# Patient Record
Sex: Male | Born: 1955 | Race: White | Hispanic: No | Marital: Married | Smoking: Never smoker
Health system: Southern US, Community
[De-identification: ages and names within clinical notes are randomized; demographics above are authoritative.]

## PROBLEM LIST (undated history)

## (undated) DIAGNOSIS — M25519 Pain in unspecified shoulder: Secondary | ICD-10-CM

## (undated) DIAGNOSIS — Z9989 Dependence on other enabling machines and devices: Principal | ICD-10-CM

## (undated) DIAGNOSIS — A6000 Herpesviral infection of urogenital system, unspecified: Secondary | ICD-10-CM

## (undated) DIAGNOSIS — H9190 Unspecified hearing loss, unspecified ear: Secondary | ICD-10-CM

## (undated) DIAGNOSIS — N418 Other inflammatory diseases of prostate: Secondary | ICD-10-CM

## (undated) DIAGNOSIS — F411 Generalized anxiety disorder: Secondary | ICD-10-CM

## (undated) DIAGNOSIS — Z5181 Encounter for therapeutic drug level monitoring: Secondary | ICD-10-CM

## (undated) DIAGNOSIS — R7303 Prediabetes: Secondary | ICD-10-CM

## (undated) DIAGNOSIS — K648 Other hemorrhoids: Secondary | ICD-10-CM

## (undated) DIAGNOSIS — F329 Major depressive disorder, single episode, unspecified: Secondary | ICD-10-CM

## (undated) DIAGNOSIS — M5020 Other cervical disc displacement, unspecified cervical region: Secondary | ICD-10-CM

## (undated) DIAGNOSIS — G4733 Obstructive sleep apnea (adult) (pediatric): Principal | ICD-10-CM

## (undated) DIAGNOSIS — F32A Depression, unspecified: Secondary | ICD-10-CM

## (undated) DIAGNOSIS — E7800 Pure hypercholesterolemia, unspecified: Secondary | ICD-10-CM

## (undated) DIAGNOSIS — G4730 Sleep apnea, unspecified: Secondary | ICD-10-CM

## (undated) DIAGNOSIS — E559 Vitamin D deficiency, unspecified: Secondary | ICD-10-CM

## (undated) DIAGNOSIS — R51 Headache: Secondary | ICD-10-CM

## (undated) DIAGNOSIS — M545 Low back pain, unspecified: Secondary | ICD-10-CM

## (undated) DIAGNOSIS — C4491 Basal cell carcinoma of skin, unspecified: Secondary | ICD-10-CM

## (undated) DIAGNOSIS — M1990 Unspecified osteoarthritis, unspecified site: Secondary | ICD-10-CM

## (undated) DIAGNOSIS — B078 Other viral warts: Secondary | ICD-10-CM

## (undated) DIAGNOSIS — G4700 Insomnia, unspecified: Secondary | ICD-10-CM

## (undated) DIAGNOSIS — G478 Other sleep disorders: Principal | ICD-10-CM

## (undated) DIAGNOSIS — M4802 Spinal stenosis, cervical region: Secondary | ICD-10-CM

## (undated) HISTORY — DX: Low back pain, unspecified: M54.50

## (undated) HISTORY — DX: Low back pain: M54.5

## (undated) HISTORY — DX: Other viral warts: B07.8

## (undated) HISTORY — DX: Basal cell carcinoma of skin, unspecified: C44.91

## (undated) HISTORY — DX: Pure hypercholesterolemia, unspecified: E78.00

## (undated) HISTORY — DX: Unspecified hearing loss, unspecified ear: H91.90

## (undated) HISTORY — DX: Generalized anxiety disorder: F41.1

## (undated) HISTORY — DX: Other inflammatory diseases of prostate: N41.8

## (undated) HISTORY — DX: Obstructive sleep apnea (adult) (pediatric): G47.33

## (undated) HISTORY — PX: VASECTOMY: SHX75

## (undated) HISTORY — DX: Herpesviral infection of urogenital system, unspecified: A60.00

## (undated) HISTORY — DX: Depression, unspecified: F32.A

## (undated) HISTORY — DX: Pain in unspecified shoulder: M25.519

## (undated) HISTORY — DX: Other sleep disorders: G47.8

## (undated) HISTORY — DX: Headache: R51

## (undated) HISTORY — DX: Unspecified osteoarthritis, unspecified site: M19.90

## (undated) HISTORY — DX: Major depressive disorder, single episode, unspecified: F32.9

## (undated) HISTORY — DX: Insomnia, unspecified: G47.00

## (undated) HISTORY — DX: Sleep apnea, unspecified: G47.30

## (undated) HISTORY — DX: Dependence on other enabling machines and devices: Z99.89

## (undated) HISTORY — DX: Encounter for therapeutic drug level monitoring: Z51.81

## (undated) HISTORY — DX: Vitamin D deficiency, unspecified: E55.9

## (undated) HISTORY — DX: Other hemorrhoids: K64.8

---

## 1989-08-04 HISTORY — PX: KNEE SURGERY: SHX244

## 2007-03-12 ENCOUNTER — Ambulatory Visit: Payer: Self-pay | Admitting: Family Medicine

## 2008-08-04 HISTORY — PX: COLONOSCOPY: SHX174

## 2008-08-04 HISTORY — PX: PROSTATE SURGERY: SHX751

## 2008-09-18 ENCOUNTER — Ambulatory Visit: Payer: Self-pay | Admitting: Family Medicine

## 2008-09-27 ENCOUNTER — Ambulatory Visit: Payer: Self-pay | Admitting: Gastroenterology

## 2008-09-27 LAB — HM COLONOSCOPY

## 2010-11-03 HISTORY — PX: OTHER SURGICAL HISTORY: SHX169

## 2010-12-10 ENCOUNTER — Ambulatory Visit: Payer: Self-pay | Admitting: Family Medicine

## 2012-06-17 ENCOUNTER — Encounter: Payer: Self-pay | Admitting: *Deleted

## 2012-06-28 ENCOUNTER — Ambulatory Visit (INDEPENDENT_AMBULATORY_CARE_PROVIDER_SITE_OTHER): Payer: BC Managed Care – PPO | Admitting: Family Medicine

## 2012-06-28 ENCOUNTER — Encounter: Payer: Self-pay | Admitting: Family Medicine

## 2012-06-28 VITALS — BP 130/86 | HR 62 | Temp 98.0°F | Resp 16 | Ht 68.75 in | Wt 196.6 lb

## 2012-06-28 DIAGNOSIS — Z0000 Encounter for general adult medical examination without abnormal findings: Secondary | ICD-10-CM

## 2012-06-28 DIAGNOSIS — Z23 Encounter for immunization: Secondary | ICD-10-CM

## 2012-06-28 LAB — CBC WITH DIFFERENTIAL/PLATELET
Basophils Absolute: 0 10*3/uL (ref 0.0–0.1)
Basophils Relative: 1 % (ref 0–1)
Eosinophils Absolute: 0.2 10*3/uL (ref 0.0–0.7)
Eosinophils Relative: 3 % (ref 0–5)
Lymphs Abs: 1.2 10*3/uL (ref 0.7–4.0)
MCH: 33.7 pg (ref 26.0–34.0)
MCHC: 36.4 g/dL — ABNORMAL HIGH (ref 30.0–36.0)
MCV: 92.4 fL (ref 78.0–100.0)
Neutrophils Relative %: 61 % (ref 43–77)
Platelets: 230 10*3/uL (ref 150–400)
RDW: 12.3 % (ref 11.5–15.5)

## 2012-06-28 LAB — COMPREHENSIVE METABOLIC PANEL
ALT: 43 U/L (ref 0–53)
Alkaline Phosphatase: 42 U/L (ref 39–117)
CO2: 27 mEq/L (ref 19–32)
Creat: 0.85 mg/dL (ref 0.50–1.35)
Glucose, Bld: 117 mg/dL — ABNORMAL HIGH (ref 70–99)
Sodium: 138 mEq/L (ref 135–145)
Total Bilirubin: 0.6 mg/dL (ref 0.3–1.2)
Total Protein: 6.4 g/dL (ref 6.0–8.3)

## 2012-06-28 LAB — LIPID PANEL
Cholesterol: 168 mg/dL (ref 0–200)
LDL Cholesterol: 88 mg/dL (ref 0–99)
Total CHOL/HDL Ratio: 3.5 Ratio
Triglycerides: 158 mg/dL — ABNORMAL HIGH (ref <150)
VLDL: 32 mg/dL (ref 0–40)

## 2012-06-28 LAB — POCT URINALYSIS DIPSTICK
Blood, UA: NEGATIVE
Glucose, UA: NEGATIVE
Nitrite, UA: NEGATIVE
Protein, UA: NEGATIVE
Spec Grav, UA: 1.01
Urobilinogen, UA: 0.2

## 2012-06-28 LAB — CK: Total CK: 104 U/L (ref 7–232)

## 2012-06-28 LAB — PSA: PSA: 0.41 ng/mL (ref <=4.00)

## 2012-06-28 MED ORDER — SIMVASTATIN 80 MG PO TABS: 80.0000 mg | ORAL_TABLET | Freq: Every day | ORAL | Status: DC

## 2012-06-28 MED ORDER — INDOMETHACIN 25 MG PO CAPS: 25.0000 mg | ORAL_CAPSULE | Freq: Two times a day (BID) | ORAL | Status: DC

## 2012-06-28 MED ORDER — ESCITALOPRAM OXALATE 20 MG PO TABS: 20.0000 mg | ORAL_TABLET | Freq: Every day | ORAL | Status: DC

## 2012-06-28 MED ORDER — ACYCLOVIR 800 MG PO TABS: 800.0000 mg | ORAL_TABLET | Freq: Three times a day (TID) | ORAL | Status: DC

## 2012-06-28 MED ORDER — ALPRAZOLAM 1 MG PO TABS: 1.0000 mg | ORAL_TABLET | Freq: Every evening | ORAL | Status: DC | PRN

## 2012-06-28 NOTE — Progress Notes (Signed)
Subjective:    Patient ID: John Preston, male    DOB: October 21, 1955, 56 y.o.   MRN: [id number]  HPIThis 56 y.o. male presents to establish care and for CPE.  Last  CPE 06/16/11.  Colonoscopy 2010.  TDAP 06/16/11.  Influenza vaccine yearly; requesting.  Eye exam 06/2012; +readers. Dental exam  Every six months.     Review of Systems  Constitutional: Negative for fever, chills, diaphoresis, activity change, appetite change, fatigue and unexpected weight change.  HENT: Negative for hearing loss, ear pain, nosebleeds, congestion, sore throat, facial swelling, rhinorrhea, sneezing, drooling, mouth sores, trouble swallowing, neck pain, neck stiffness, dental problem, voice change, postnasal drip, sinus pressure, tinnitus and ear discharge.   Eyes: Positive for visual disturbance. Negative for photophobia, pain, discharge, redness and itching.  Respiratory: Negative for apnea, cough, choking, chest tightness, shortness of breath, wheezing and stridor.   Cardiovascular: Negative for chest pain, palpitations and leg swelling.  Gastrointestinal: Negative for nausea, vomiting, abdominal pain, diarrhea, constipation, blood in stool, abdominal distention, anal bleeding and rectal pain.  Endocrine: Negative for cold intolerance, heat intolerance, polydipsia, polyphagia and polyuria.  Genitourinary: Positive for difficulty urinating. Negative for dysuria, urgency, frequency, hematuria, flank pain, decreased urine volume, discharge, penile swelling, scrotal swelling, enuresis, genital sores, penile pain and testicular pain.  Musculoskeletal: Positive for back pain. Negative for myalgias, joint swelling, arthralgias and gait problem.  Skin: Negative for color change, pallor, rash and wound.  Allergic/Immunologic: Negative for immunocompromised state.  Neurological: Negative for dizziness, tremors, seizures, syncope, facial asymmetry, speech difficulty, weakness, light-headedness, numbness and headaches.    Hematological: Negative for adenopathy. Does not bruise/bleed easily.  Psychiatric/Behavioral: Positive for sleep disturbance. Negative for suicidal ideas, hallucinations, behavioral problems, confusion, self-injury, dysphoric mood, decreased concentration and agitation. The patient is nervous/anxious. The patient is not hyperactive.        Objective:   Physical Exam  Nursing note and vitals reviewed. Constitutional: He is oriented to person, place, and time. He appears well-developed and well-nourished. No distress.  HENT:  Head: Normocephalic and atraumatic.  Right Ear: External ear normal.  Left Ear: External ear normal.  Nose: Nose normal.  Mouth/Throat: Oropharynx is clear and moist.  Eyes: Conjunctivae and EOM are normal. Pupils are equal, round, and reactive to light.  Neck: Normal range of motion. Neck supple. No JVD present. No thyromegaly present.  Cardiovascular: Normal rate, regular rhythm, normal heart sounds and intact distal pulses.  Exam reveals no gallop and no friction rub.   No murmur heard. Pulmonary/Chest: Effort normal and breath sounds normal. He has no wheezes. He has no rales.  Abdominal: Soft. Bowel sounds are normal. He exhibits no distension and no mass. There is no tenderness. There is no rebound and no guarding. Hernia confirmed negative in the right inguinal area and confirmed negative in the left inguinal area.  Genitourinary: Rectum normal, prostate normal, testes normal and penis normal. Right testis shows no mass, no swelling and no tenderness. Left testis shows no mass, no swelling and no tenderness. No penile tenderness.  Musculoskeletal:       Right shoulder: Normal.       Left shoulder: Normal.       Cervical back: Normal.       Thoracic back: Normal.       Lumbar back: Normal.  Lymphadenopathy:    He has no cervical adenopathy.       Right: No inguinal adenopathy present.       Left: No inguinal adenopathy present.  Neurological: He is alert  and oriented to person, place, and time. He has normal reflexes. No cranial nerve deficit. He exhibits normal muscle tone. Coordination normal.  Skin: Skin is warm and dry. No rash noted. He is not diaphoretic. No erythema. No pallor.  +tattoos.  Psychiatric: He has a normal mood and affect. His behavior is normal. Judgment and thought content normal.  +anxious.    EKG: NSR.  INFLUENZA VACCINE ADMINISTERED.      Results for orders placed in visit on 06/28/12  POCT URINALYSIS DIPSTICK      Component Value Range   Color, UA yellow     Clarity, UA clear     Glucose, UA neg     Bilirubin, UA neg     Ketones, UA neg     Spec Grav, UA 1.010     Blood, UA neg     pH, UA 6.0     Protein, UA neg     Urobilinogen, UA 0.2     Nitrite, UA neg     Leukocytes, UA Negative      Assessment & Plan:  Routine general medical examination at a health care facility - Plan: POCT urinalysis dipstick, EKG 12-Lead, CBC with Differential, CK, Comprehensive metabolic panel, Lipid panel, Hemoglobin A1c, TSH, Vitamin B12, Folate, Vitamin D 25 hydroxy, PSA  Need for prophylactic vaccination and inoculation against influenza - Plan: Flu vaccine greater than or equal to 3yo preservative free IM    1. CPE:  Anticipatory guidance provided --- exercise, weight loss.  Immunizations UTD: s/p influenza vaccine in office.  Obtain labs.  Colonoscopy UTD. 2.  S/p influenza vaccine in office. 3. Hyperlipidemia: controlled; obtain labs; continue current medication. 4. Anxiety: persistent; continue current medications.  Meds ordered this encounter  Medications  . acyclovir (ZOVIRAX) 800 MG tablet    Sig: Take 1 tablet (800 mg total) by mouth 3 (three) times daily.    Dispense:  30 tablet    Refill:  4  . ALPRAZolam (XANAX) 1 MG tablet    Sig: Take 1 tablet (1 mg total) by mouth at bedtime as needed.    Dispense:  30 tablet    Refill:  5  . escitalopram (LEXAPRO) 20 MG tablet    Sig: Take 1 tablet (20 mg  total) by mouth daily.    Dispense:  30 tablet    Refill:  5  . indomethacin (INDOCIN) 25 MG capsule    Sig: Take 1 capsule (25 mg total) by mouth 2 (two) times daily with a meal.    Dispense:  60 capsule    Refill:  3  . simvastatin (ZOCOR) 80 MG tablet    Sig: Take 1 tablet (80 mg total) by mouth at bedtime.    Dispense:  30 tablet    Refill:  11

## 2012-06-28 NOTE — Patient Instructions (Addendum)
1. Routine general medical examination at a health care facility  POCT urinalysis dipstick, EKG 12-Lead, CBC with Differential, CK, Comprehensive metabolic panel, Lipid panel, Hemoglobin A1c, TSH, Vitamin B12, Folate, Vitamin D 25 hydroxy, PSA  2. Need for prophylactic vaccination and inoculation against influenza       1.  DECREASE LEXAPRO 20MG  TO 1/2 TABLET DAILY.

## 2012-06-29 LAB — VITAMIN D 25 HYDROXY (VIT D DEFICIENCY, FRACTURES): Vit D, 25-Hydroxy: 47 ng/mL (ref 30–89)

## 2012-09-18 NOTE — Progress Notes (Signed)
Reviewed and agree.

## 2012-10-21 ENCOUNTER — Encounter: Payer: Self-pay | Admitting: Family Medicine

## 2013-01-03 ENCOUNTER — Ambulatory Visit: Payer: BC Managed Care – PPO | Admitting: Family Medicine

## 2013-01-05 ENCOUNTER — Other Ambulatory Visit: Payer: Self-pay | Admitting: Family Medicine

## 2013-01-06 ENCOUNTER — Telehealth: Payer: Self-pay

## 2013-01-06 MED ORDER — ALPRAZOLAM 1 MG PO TABS: 1.0000 mg | ORAL_TABLET | Freq: Every evening | ORAL | Status: DC | PRN

## 2013-01-06 NOTE — Telephone Encounter (Signed)
Pt came to 104 to request refill on xanax (generic). He said that he had already contacted the [facility] in [place]. Has an appt to see dr Katrinka Blazing on 01-19-13.

## 2013-01-06 NOTE — Telephone Encounter (Signed)
Called in called patient.

## 2013-01-06 NOTE — Telephone Encounter (Signed)
Please advise pended 

## 2013-01-06 NOTE — Telephone Encounter (Signed)
Please call in Xanax refill and advise pt that called in.

## 2013-01-19 ENCOUNTER — Ambulatory Visit: Payer: BC Managed Care – PPO | Admitting: Family Medicine

## 2013-01-20 ENCOUNTER — Ambulatory Visit (INDEPENDENT_AMBULATORY_CARE_PROVIDER_SITE_OTHER): Payer: PRIVATE HEALTH INSURANCE | Admitting: Family Medicine

## 2013-01-20 VITALS — BP 122/70 | HR 78 | Temp 97.8°F | Resp 18 | Ht 69.0 in | Wt 187.0 lb

## 2013-01-20 DIAGNOSIS — M5136 Other intervertebral disc degeneration, lumbar region: Secondary | ICD-10-CM

## 2013-01-20 DIAGNOSIS — R7309 Other abnormal glucose: Secondary | ICD-10-CM

## 2013-01-20 DIAGNOSIS — F411 Generalized anxiety disorder: Secondary | ICD-10-CM

## 2013-01-20 DIAGNOSIS — M5137 Other intervertebral disc degeneration, lumbosacral region: Secondary | ICD-10-CM

## 2013-01-20 DIAGNOSIS — F1010 Alcohol abuse, uncomplicated: Secondary | ICD-10-CM

## 2013-01-20 DIAGNOSIS — E7800 Pure hypercholesterolemia, unspecified: Secondary | ICD-10-CM

## 2013-01-20 LAB — CBC WITH DIFFERENTIAL/PLATELET
Basophils Relative: 0 % (ref 0–1)
Eosinophils Absolute: 0.1 10*3/uL (ref 0.0–0.7)
Lymphs Abs: 1.4 10*3/uL (ref 0.7–4.0)
MCH: 31.9 pg (ref 26.0–34.0)
MCHC: 34.7 g/dL (ref 30.0–36.0)
Neutro Abs: 4.3 10*3/uL (ref 1.7–7.7)
Neutrophils Relative %: 67 % (ref 43–77)
Platelets: 213 10*3/uL (ref 150–400)
RBC: 4.92 MIL/uL (ref 4.22–5.81)

## 2013-01-20 LAB — LIPID PANEL
LDL Cholesterol: 68 mg/dL (ref 0–99)
Total CHOL/HDL Ratio: 3.6 Ratio
VLDL: 53 mg/dL — ABNORMAL HIGH (ref 0–40)

## 2013-01-20 LAB — HEMOGLOBIN A1C
Hgb A1c MFr Bld: 5.5 % (ref <5.7)
Mean Plasma Glucose: 111 mg/dL (ref <117)

## 2013-01-20 LAB — COMPREHENSIVE METABOLIC PANEL
ALT: 51 U/L (ref 0–53)
AST: 32 U/L (ref 0–37)
Creat: 0.93 mg/dL (ref 0.50–1.35)
Total Bilirubin: 0.9 mg/dL (ref 0.3–1.2)

## 2013-01-20 LAB — CK: Total CK: 89 U/L (ref 7–232)

## 2013-01-20 MED ORDER — ALPRAZOLAM 1 MG PO TABS: 1.0000 mg | ORAL_TABLET | Freq: Every evening | ORAL | Status: DC | PRN

## 2013-01-20 MED ORDER — ESCITALOPRAM OXALATE 20 MG PO TABS: 20.0000 mg | ORAL_TABLET | Freq: Every day | ORAL | Status: DC

## 2013-01-20 NOTE — Progress Notes (Signed)
[address]   [place], [place]  [address]   [phone]  Subjective:    Patient ID: John Preston, male    DOB: 1956/04/19, 57 y.o.   MRN: [id number]  HPI This 57 y.o. male presents for six month follow-up for the following:  1. Hyperlipidemia:six month follow-up; no changes to management made at last visit; reports good tolerance to treatment; good compliance with treatment; good symptom control.   Taking 40mg  of Simvastatin.  2. Glucose intolerance: fasting.  Weight down 9 pounds since last visit.  3.  Anxiety:  Wife and daughter moved Dec 13, 2012 to [place]; Chelsea went with mother.  Daughter able to transfer with [facility].  Pennie Rushing is getting married in August.  Not putting house on market until after wedding in August.  Boyd Kerbs is living with her mom.  Penny's mom is 47 years old.  Increased Lexapro to 20mg  daily with stress at home.  Xanax 1 at bedtime.  Sleeping well.  Sometimes will forget to take Xanax.    4.  DDD lumbar: s/p repeat steroid injection by [facility].  Refill of Indomethacin for lower back pain.  S1-L1-L2.    Review of Systems  Constitutional: Negative for fever, chills, diaphoresis, activity change, appetite change and fatigue.  Respiratory: Negative for cough, shortness of breath, wheezing and stridor.   Cardiovascular: Negative for chest pain, palpitations and leg swelling.  Gastrointestinal: Negative for nausea, vomiting, abdominal pain and diarrhea.  Musculoskeletal: Positive for back pain.  Neurological: Negative for dizziness, tremors, seizures, syncope, facial asymmetry, speech difficulty, weakness, light-headedness, numbness and headaches.  Psychiatric/Behavioral: Positive for sleep disturbance. Negative for suicidal ideas, self-injury and dysphoric mood. The patient is nervous/anxious.    Past Medical History  Diagnosis Date  . Pain in joint, shoulder region   . Other specified viral warts   . Pure hypercholesterolemia   . Genital herpes, unspecified     . Other specified inflammatory disease of prostate     outlet obstruction with BPH.Urology consult 2006 [facility]  . Unspecified hearing loss   . Insomnia, unspecified   . Encounter for therapeutic drug monitoring   . Lumbago   . Internal hemorrhoids without mention of complication   . Anxiety state, unspecified   . Unspecified vitamin D deficiency   . Basal cell carcinoma of skin, site unspecified     L nasal; [facility].  . Genital herpes   . Depression    Past Surgical History  Procedure Laterality Date  . Knee surgery  1991  . Prostate surgery  2010    for BPH  . L. facial basal cell carcinoma resection  11/2010  . Colonoscopy  08/04/2008    normal; repeat in 10 years.    . Vasectomy     Current Outpatient Prescriptions on File Prior to Visit  Medication Sig Dispense Refill  . acyclovir (ZOVIRAX) 800 MG tablet Take 1 tablet (800 mg total) by mouth 3 (three) times daily.  30 tablet  4  . indomethacin (INDOCIN) 25 MG capsule Take 1 capsule (25 mg total) by mouth 2 (two) times daily with a meal.  60 capsule  3  . Multiple Vitamin (MULTIVITAMIN) tablet Take 1 tablet by mouth daily.      . simvastatin (ZOCOR) 80 MG tablet Take 1 tablet (80 mg total) by mouth at bedtime.  30 tablet  11   No current facility-administered medications on file prior to visit.   History   Social History  . Marital Status: Married  Spouse Name: N/A    Number of Children: 2  . Years of Education: N/A   Occupational History  . Sales    Social History Main Topics  . Smoking status: Never Smoker   . Smokeless tobacco: Never Used  . Alcohol Use: 9.0 oz/week    15 Glasses of wine per week     Comment: moderate 1-2 glasses of wine per day  . Drug Use: No     Comment: prevoiusly used marijuana  . Sexually Active: Yes   Other Topics Concern  . Not on file   Social History Narrative   Marital status:  Married x 29 years, happily.      Children: two children; no grandchildren.       Lives: with wife, adult daughter   Always uses seat belts. Smoke alarm in the home. Carbon monoxide detector in the home. Guns in the home not stored in locked cabinet. Exercise: Light, job very physically demanding walks a lot. Caffeine use: 2 servings Coffee per day.       Objective:   Physical Exam  Nursing note and vitals reviewed. Constitutional: He is oriented to person, place, and time. He appears well-developed and well-nourished. No distress.  HENT:  Head: Normocephalic and atraumatic.  Mouth/Throat: Oropharynx is clear and moist.  Eyes: Conjunctivae and EOM are normal. Pupils are equal, round, and reactive to light.  Neck: Normal range of motion. Neck supple. No JVD present. No thyromegaly present.  Cardiovascular: Normal rate, regular rhythm, normal heart sounds and intact distal pulses.  Exam reveals no gallop and no friction rub.   No murmur heard. Pulmonary/Chest: Effort normal and breath sounds normal. He has no wheezes. He has no rales.  Abdominal: Soft. Bowel sounds are normal. There is no tenderness. There is no rebound and no guarding.  Lymphadenopathy:    He has no cervical adenopathy.  Neurological: He is alert and oriented to person, place, and time. No cranial nerve deficit. He exhibits normal muscle tone. Coordination normal.  Skin: He is not diaphoretic.  Psychiatric: He has a normal mood and affect. His behavior is normal. Judgment and thought content normal.      Assessment & Plan:  Pure hypercholesterolemia - Plan: CBC with Differential, CK, Comprehensive metabolic panel, Lipid panel  Other abnormal glucose - Plan: Comprehensive metabolic panel, Hemoglobin A1c  Generalized anxiety disorder - Plan: escitalopram (LEXAPRO) 20 MG tablet, ALPRAZolam (XANAX) 1 MG tablet, DISCONTINUED: ALPRAZolam (XANAX) 1 MG tablet  DDD (degenerative disc disease), lumbar   1. Hypercholesterolemia: controlled; obtain labs; refills provided. 2.  Glucose intolerance: New.  Obtain labs; +weight loss since last visit. 3.  Generalized anxiety disorder: stable despite stressors of upcoming wedding and move to [place]; counseling provided; refilled medication.Continue Lexapro 20mg  one daily. 4. DDD lumbar: worsened after last visit; managed by ortho.  S/p injection.  Meds ordered this encounter  Medications  . escitalopram (LEXAPRO) 20 MG tablet    Sig: Take 1 tablet (20 mg total) by mouth daily.    Dispense:  30 tablet    Refill:  5  . DISCONTD: ALPRAZolam (XANAX) 1 MG tablet    Sig: Take 1 tablet (1 mg total) by mouth at bedtime as needed.    Dispense:  30 tablet    Refill:  5  . ALPRAZolam (XANAX) 1 MG tablet    Sig: Take 1 tablet (1 mg total) by mouth at bedtime as needed.    Dispense:  30 tablet    Refill:  5    

## 2013-04-11 ENCOUNTER — Other Ambulatory Visit: Payer: Self-pay | Admitting: Family Medicine

## 2013-07-30 ENCOUNTER — Other Ambulatory Visit: Payer: Self-pay | Admitting: Family Medicine

## 2013-09-05 ENCOUNTER — Encounter: Payer: Self-pay | Admitting: Family Medicine

## 2013-09-05 ENCOUNTER — Ambulatory Visit (INDEPENDENT_AMBULATORY_CARE_PROVIDER_SITE_OTHER): Payer: PRIVATE HEALTH INSURANCE | Admitting: Family Medicine

## 2013-09-05 VITALS — BP 120/88 | HR 68 | Temp 97.7°F | Resp 16 | Ht 69.0 in | Wt 201.2 lb

## 2013-09-05 DIAGNOSIS — R7309 Other abnormal glucose: Secondary | ICD-10-CM

## 2013-09-05 DIAGNOSIS — Z23 Encounter for immunization: Secondary | ICD-10-CM

## 2013-09-05 DIAGNOSIS — Z125 Encounter for screening for malignant neoplasm of prostate: Secondary | ICD-10-CM

## 2013-09-05 DIAGNOSIS — Z0000 Encounter for general adult medical examination without abnormal findings: Secondary | ICD-10-CM

## 2013-09-05 DIAGNOSIS — F411 Generalized anxiety disorder: Secondary | ICD-10-CM

## 2013-09-05 DIAGNOSIS — Z1211 Encounter for screening for malignant neoplasm of colon: Secondary | ICD-10-CM

## 2013-09-05 DIAGNOSIS — E7800 Pure hypercholesterolemia, unspecified: Secondary | ICD-10-CM

## 2013-09-05 DIAGNOSIS — M5136 Other intervertebral disc degeneration, lumbar region: Secondary | ICD-10-CM

## 2013-09-05 LAB — POCT URINALYSIS DIPSTICK
Bilirubin, UA: NEGATIVE
Blood, UA: NEGATIVE
GLUCOSE UA: NEGATIVE
Ketones, UA: NEGATIVE
LEUKOCYTES UA: NEGATIVE
NITRITE UA: NEGATIVE
PROTEIN UA: NEGATIVE
SPEC GRAV UA: 1.025
Urobilinogen, UA: 0.2
pH, UA: 5.5

## 2013-09-05 LAB — COMPLETE METABOLIC PANEL WITH GFR
ALBUMIN: 4.4 g/dL (ref 3.5–5.2)
ALT: 37 U/L (ref 0–53)
AST: 26 U/L (ref 0–37)
Alkaline Phosphatase: 44 U/L (ref 39–117)
BUN: 21 mg/dL (ref 6–23)
CALCIUM: 9 mg/dL (ref 8.4–10.5)
CHLORIDE: 103 meq/L (ref 96–112)
CO2: 26 mEq/L (ref 19–32)
Creat: 0.95 mg/dL (ref 0.50–1.35)
GFR, Est African American: >89 mL/min
GLUCOSE: 105 mg/dL — AB (ref 70–99)
POTASSIUM: 4.2 meq/L (ref 3.5–5.3)
Sodium: 138 mEq/L (ref 135–145)
Total Bilirubin: 0.5 mg/dL (ref 0.2–1.2)
Total Protein: 6.7 g/dL (ref 6.0–8.3)

## 2013-09-05 LAB — CBC WITH DIFFERENTIAL/PLATELET
Basophils Absolute: 0 10*3/uL (ref 0.0–0.1)
Basophils Relative: 1 % (ref 0–1)
Eosinophils Absolute: 0.2 10*3/uL (ref 0.0–0.7)
Eosinophils Relative: 5 % (ref 0–5)
HEMATOCRIT: 46.2 % (ref 39.0–52.0)
HEMOGLOBIN: 15.9 g/dL (ref 13.0–17.0)
LYMPHS PCT: 35 % (ref 12–46)
Lymphs Abs: 1.6 10*3/uL (ref 0.7–4.0)
MCH: 32.4 pg (ref 26.0–34.0)
MCHC: 34.4 g/dL (ref 30.0–36.0)
MCV: 94.1 fL (ref 78.0–100.0)
MONO ABS: 0.4 10*3/uL (ref 0.1–1.0)
MONOS PCT: 9 % (ref 3–12)
NEUTROS ABS: 2.4 10*3/uL (ref 1.7–7.7)
Neutrophils Relative %: 50 % (ref 43–77)
Platelets: 221 10*3/uL (ref 150–400)
RBC: 4.91 MIL/uL (ref 4.22–5.81)
RDW: 12.5 % (ref 11.5–15.5)
WBC: 4.7 10*3/uL (ref 4.0–10.5)

## 2013-09-05 LAB — PSA: PSA: 0.42 ng/mL (ref <=4.00)

## 2013-09-05 LAB — LIPID PANEL
Cholesterol: 176 mg/dL (ref 0–200)
HDL: 44 mg/dL (ref >39)
LDL CALC: 91 mg/dL (ref 0–99)
Total CHOL/HDL Ratio: 4 Ratio
Triglycerides: 205 mg/dL — ABNORMAL HIGH (ref <150)
VLDL: 41 mg/dL — AB (ref 0–40)

## 2013-09-05 LAB — TSH: TSH: 4.293 u[IU]/mL (ref 0.350–4.500)

## 2013-09-05 LAB — HEMOGLOBIN A1C
HEMOGLOBIN A1C: 5.8 % — AB (ref <5.7)
Mean Plasma Glucose: 120 mg/dL — ABNORMAL HIGH (ref <117)

## 2013-09-05 LAB — IFOBT (OCCULT BLOOD): IMMUNOLOGICAL FECAL OCCULT BLOOD TEST: POSITIVE

## 2013-09-05 MED ORDER — SIMVASTATIN 80 MG PO TABS: 80.0000 mg | ORAL_TABLET | Freq: Every day | ORAL | Status: DC

## 2013-09-05 MED ORDER — ESCITALOPRAM OXALATE 20 MG PO TABS: 20.0000 mg | ORAL_TABLET | Freq: Every day | ORAL | Status: DC

## 2013-09-05 MED ORDER — INDOMETHACIN 25 MG PO CAPS: 25.0000 mg | ORAL_CAPSULE | Freq: Two times a day (BID) | ORAL | Status: DC

## 2013-09-05 MED ORDER — ALPRAZOLAM 1 MG PO TABS: 1.0000 mg | ORAL_TABLET | Freq: Every evening | ORAL | Status: DC | PRN

## 2013-09-05 NOTE — Patient Instructions (Signed)
1. Start Aspirin 81mg  one tablet daily. 2. Consider a sleep study.

## 2013-09-05 NOTE — Progress Notes (Signed)
Subjective:    Patient ID: John Preston, male    DOB: 1956/06/23, 58 y.o.   MRN: [id number]  HPI This 58 y.o. male presents for Complete Physical Examination.  Last physical 06/2012.  TDAP 2012. Flu vaccine 2014 and requesting today. Colonoscopy 2010; internal hemorrhoids; no polyps; repeat in 10 years. [facility]. Eye exam 2014; no g/c.  +readers. Dental exam every six months.. Last dermatology evaluation 1.5-2 years.  Anxiety: a lot has occurred in past six months.  Wife moved home from [place]; was very unhappy with job in [place].  Daughter tried to commit suicide x 2 while extremely intoxicated.  Son got married. Wife is not working; has just started looking for a job.  Patient now much better emotionally since wife is home and daughter is home.  Denies SI/HI.    Hyperlipidemia: compliance with medications; took Fish Oil for four bottles; forgot second pill most days.  Review of Systems  HENT: Negative.   Eyes: Negative.   Respiratory: Negative.   Cardiovascular: Negative.   Gastrointestinal: Negative.   Endocrine: Negative.   Genitourinary: Positive for decreased urine volume and difficulty urinating. Negative for dysuria, urgency, frequency, hematuria, flank pain, discharge, penile swelling, scrotal swelling, genital sores, penile pain and testicular pain.  Musculoskeletal: Positive for back pain and myalgias. Negative for arthralgias, gait problem, joint swelling, neck pain and neck stiffness.  Skin: Negative.   Allergic/Immunologic: Negative.   Neurological: Negative.   Hematological: Negative.   Psychiatric/Behavioral: Positive for sleep disturbance. Negative for suicidal ideas, hallucinations, behavioral problems, confusion, self-injury, dysphoric mood, decreased concentration and agitation. The patient is nervous/anxious. The patient is not hyperactive.    Past Medical History  Diagnosis Date  . Pain in joint, shoulder region   . Other specified viral  warts   . Pure hypercholesterolemia   . Genital herpes, unspecified   . Other specified inflammatory disease of prostate     outlet obstruction with BPH.Urology consult 2006 [facility]  . Unspecified hearing loss   . Insomnia, unspecified   . Encounter for therapeutic drug monitoring   . Lumbago   . Internal hemorrhoids without mention of complication   . Anxiety state, unspecified   . Unspecified vitamin D deficiency   . Basal cell carcinoma of skin, site unspecified     L nasal; [facility].  . Genital herpes   . Depression   . Arthritis   . Internal hemorrhoids     colonoscopy 08/04/2008.  [facility].   Past Surgical History  Procedure Laterality Date  . Knee surgery  1991  . Prostate surgery  2010    for BPH  . L. facial basal cell carcinoma resection  11/2010  . Colonoscopy  08/04/2008    internal hemorrhoids; repeat in 10 years.  [facility].  . Vasectomy     No Known Allergies Current Outpatient Prescriptions on File Prior to Visit  Medication Sig Dispense Refill  . acyclovir (ZOVIRAX) 800 MG tablet Take 1 tablet (800 mg total) by mouth 3 (three) times daily.  30 tablet  4  . Multiple Vitamin (MULTIVITAMIN) tablet Take 1 tablet by mouth daily.       No current facility-administered medications on file prior to visit.   History   Social History  . Marital Status: Married    Spouse Name: N/A    Number of Children: 2  . Years of Education: N/A   Occupational History  . Sales    Social History Main Topics  . Smoking status: Never Smoker   .  Smokeless tobacco: Never Used  . Alcohol Use: 9.0 oz/week    15 Glasses of wine per week     Comment: moderate 1-2 glasses of wine per day  . Drug Use: No     Comment: prevoiusly used marijuana  . Sexual Activity: Yes   Other Topics Concern  . Not on file   Social History Narrative   Marital status:  Married x 31 years, happily.      Children: two children; no grandchildren.      Lives: with wife, adult daughter.       Tobacco:  None       Alcohol:   2 glasses of wine per night.   Always uses seat belts. Smoke alarm in the home. Carbon monoxide detector in the home. Guns in the home not stored in locked cabinet. Exercise: Light, job very physically demanding walks a lot. Caffeine use: 2 servings Coffee per day.   Family History  Problem Relation Age of Onset  . COPD Mother   . Diabetes Mother   . Heart disease Mother 66    cardiac stenting/CAD; no AMI  . Cancer Maternal Grandmother   . Diabetes Maternal Grandmother   . Lung disease      Oxygen dependent  . Tics Father   . Dementia Father   . Mental illness Father        Objective:   Physical Exam  Constitutional: He is oriented to person, place, and time. He appears well-developed and well-nourished. No distress.  HENT:  Head: Normocephalic and atraumatic.  Right Ear: External ear normal.  Left Ear: External ear normal.  Nose: Nose normal.  Mouth/Throat: Oropharynx is clear and moist.  Eyes: Conjunctivae and EOM are normal. Pupils are equal, round, and reactive to light.  Neck: Normal range of motion. Neck supple. Carotid bruit is not present. No thyromegaly present.  Cardiovascular: Normal rate, regular rhythm, normal heart sounds and intact distal pulses.  Exam reveals no gallop and no friction rub.   No murmur heard. Pulmonary/Chest: Effort normal and breath sounds normal. He has no wheezes. He has no rales.  Abdominal: Soft. Bowel sounds are normal. He exhibits no distension and no mass. There is no tenderness. There is no rebound and no guarding. Hernia confirmed negative in the right inguinal area and confirmed negative in the left inguinal area.  Genitourinary: Rectum normal, prostate normal, testes normal and penis normal. Right testis shows no mass, no swelling and no tenderness. Left testis shows no mass, no swelling and no tenderness. Circumcised.  Musculoskeletal:       Right shoulder: Normal.       Left shoulder: Normal.        Cervical back: Normal.  Lymphadenopathy:    He has no cervical adenopathy.       Right: No inguinal adenopathy present.       Left: No inguinal adenopathy present.  Neurological: He is alert and oriented to person, place, and time. He has normal reflexes. No cranial nerve deficit. He exhibits normal muscle tone. Coordination normal.  Skin: Skin is warm and dry. No rash noted. He is not diaphoretic.  Psychiatric: He has a normal mood and affect. His behavior is normal. Judgment and thought content normal.   EKG: NSR  INFLUENZA VACCINE ADMINISTERED IN OFFICE.    Assessment & Plan:  Routine general medical examination at a health care facility - Plan: CBC with Differential, COMPLETE METABOLIC PANEL WITH GFR, Hemoglobin A1c, Lipid panel, PSA, TSH, POCT  urinalysis dipstick, EKG 12-Lead, Flu Vaccine QUAD 36+ mos IM, IFOBT POC (occult bld, rslt in office)  Other abnormal glucose  Pure hypercholesterolemia  Generalized anxiety disorder - Plan: ALPRAZolam (XANAX) 1 MG tablet, escitalopram (LEXAPRO) 20 MG tablet  DDD (degenerative disc disease), lumbar  Prostate cancer screening - Plan: IFOBT POC (occult bld, rslt in office)  Colon cancer screening - Plan: IFOBT POC (occult bld, rslt in office)   1.  CPE: Anticipatory guidance --- ASA 81mg  one tablet daily.  Immunizations UTD: s/p flu vaccine in office.  Colonoscopy UTD.  Obtain labs. 2. Prostate cancer screening: completed; s/p DRE; obtain PSA. 3.  Colon cancer screening:  Colonoscopy UTD: s/p hemosure.  Asymptomatic. 4.  Anxiety with depression: stable despite multiple family stressors; counseling provided; refills of Lexapro and Xanax provided; has decreased Xanax use and alcohol intake significantly. 5.  Hypercholesterolemia: controlled; obtain labs; refills provided.   6.  Glucose Intolerance: stable and improved at last visit; repeat labs. Dietary modification. 7.  DDD: stable.  Followed by ortho.  Meds ordered this encounter    Medications  . ALPRAZolam (XANAX) 1 MG tablet    Sig: Take 1 tablet (1 mg total) by mouth at bedtime as needed.    Dispense:  30 tablet    Refill:  5  . escitalopram (LEXAPRO) 20 MG tablet    Sig: Take 1 tablet (20 mg total) by mouth daily.    Dispense:  30 tablet    Refill:  5  . indomethacin (INDOCIN) 25 MG capsule    Sig: Take 1 capsule (25 mg total) by mouth 2 (two) times daily with a meal.    Dispense:  60 capsule    Refill:  3  . simvastatin (ZOCOR) 80 MG tablet    Sig: Take 1 tablet (80 mg total) by mouth at bedtime.    Dispense:  30 tablet    Refill:  11   [name], M.D.  [facility] [address] [place], [place]  [address] [phone] phone [phone] fax

## 2014-03-06 ENCOUNTER — Ambulatory Visit (INDEPENDENT_AMBULATORY_CARE_PROVIDER_SITE_OTHER): Payer: PRIVATE HEALTH INSURANCE | Admitting: Family Medicine

## 2014-03-06 ENCOUNTER — Encounter: Payer: Self-pay | Admitting: Family Medicine

## 2014-03-06 VITALS — BP 112/74 | HR 61 | Temp 97.7°F | Resp 16 | Ht 68.75 in | Wt 198.4 lb

## 2014-03-06 DIAGNOSIS — G4700 Insomnia, unspecified: Secondary | ICD-10-CM

## 2014-03-06 DIAGNOSIS — F411 Generalized anxiety disorder: Secondary | ICD-10-CM

## 2014-03-06 DIAGNOSIS — R7309 Other abnormal glucose: Secondary | ICD-10-CM

## 2014-03-06 DIAGNOSIS — E7800 Pure hypercholesterolemia, unspecified: Secondary | ICD-10-CM

## 2014-03-06 LAB — COMPLETE METABOLIC PANEL WITH GFR
ALBUMIN: 4.2 g/dL (ref 3.5–5.2)
ALT: 40 U/L (ref 0–53)
AST: 24 U/L (ref 0–37)
Alkaline Phosphatase: 45 U/L (ref 39–117)
BILIRUBIN TOTAL: 0.5 mg/dL (ref 0.2–1.2)
BUN: 11 mg/dL (ref 6–23)
CO2: 25 meq/L (ref 19–32)
Calcium: 8.7 mg/dL (ref 8.4–10.5)
Chloride: 104 mEq/L (ref 96–112)
Creat: 0.94 mg/dL (ref 0.50–1.35)
GLUCOSE: 105 mg/dL — AB (ref 70–99)
Potassium: 3.9 mEq/L (ref 3.5–5.3)
SODIUM: 139 meq/L (ref 135–145)
TOTAL PROTEIN: 6.3 g/dL (ref 6.0–8.3)

## 2014-03-06 LAB — CBC WITH DIFFERENTIAL/PLATELET
Basophils Absolute: 0 10*3/uL (ref 0.0–0.1)
Basophils Relative: 1 % (ref 0–1)
Eosinophils Absolute: 0.3 10*3/uL (ref 0.0–0.7)
Eosinophils Relative: 6 % — ABNORMAL HIGH (ref 0–5)
HEMATOCRIT: 42.2 % (ref 39.0–52.0)
Hemoglobin: 14.9 g/dL (ref 13.0–17.0)
LYMPHS ABS: 1.4 10*3/uL (ref 0.7–4.0)
Lymphocytes Relative: 31 % (ref 12–46)
MCH: 32.5 pg (ref 26.0–34.0)
MCHC: 35.3 g/dL (ref 30.0–36.0)
MCV: 92.1 fL (ref 78.0–100.0)
Monocytes Absolute: 0.4 10*3/uL (ref 0.1–1.0)
Monocytes Relative: 9 % (ref 3–12)
NEUTROS PCT: 53 % (ref 43–77)
Neutro Abs: 2.4 10*3/uL (ref 1.7–7.7)
Platelets: 250 10*3/uL (ref 150–400)
RBC: 4.58 MIL/uL (ref 4.22–5.81)
RDW: 12.8 % (ref 11.5–15.5)
WBC: 4.5 10*3/uL (ref 4.0–10.5)

## 2014-03-06 LAB — LIPID PANEL
CHOLESTEROL: 168 mg/dL (ref 0–200)
HDL: 46 mg/dL (ref >39)
LDL CALC: 77 mg/dL (ref 0–99)
Total CHOL/HDL Ratio: 3.7 Ratio
Triglycerides: 224 mg/dL — ABNORMAL HIGH (ref <150)
VLDL: 45 mg/dL — ABNORMAL HIGH (ref 0–40)

## 2014-03-06 LAB — HEMOGLOBIN A1C
HEMOGLOBIN A1C: 5.5 % (ref <5.7)
Mean Plasma Glucose: 111 mg/dL (ref <117)

## 2014-03-06 MED ORDER — ESCITALOPRAM OXALATE 20 MG PO TABS: 20.0000 mg | ORAL_TABLET | Freq: Every day | ORAL | Status: DC

## 2014-03-06 MED ORDER — ALPRAZOLAM 1 MG PO TABS: 1.0000 mg | ORAL_TABLET | Freq: Every evening | ORAL | Status: DC | PRN

## 2014-03-06 NOTE — Progress Notes (Signed)
Patient ID: John Preston, male   DOB: 1955-08-30, 58 y.o.   MRN: [id number]   Subjective:  This chart was scribed for John Forts, MD by [name], Medical Scribe. This patient was seen in Room 21 and the patient's care was started at 8:21 AM.   Patient ID: John Preston, male    DOB: 1955-12-28, 58 y.o.   MRN: [id number]  03/06/2014  Medication Refill, Hyperlipidemia, Hyperglycemia and Anxiety  HPI HPI Comments: [name] is a 58 y.o. male who presents to the [facility] for six month follow-up of hyperlipidemia, glucose intolerance, and anxiety.  No changes were made to his management 6 months ago.  He will experience intermittent, random, sharp chest pain that will last 30 seconds to a minute.  He suspects that this is heartburn related.  He does not experience the pain when he is exerting himself.  He does not endorse SOB or diaphoresis as associated symptoms.  There have been no drastic changes in his bowel movements.  He states that he gets a lot of exercise at work but does not go to the gym daily.  His family is doing well and he has had a good 6 months.  His 42 year old daughter is living with her boyfriend and he likes him; daughter is much more stable at this time.  His son is 106 and living in [place] with his wife.  Hyperlipidemia - He takes 74m of Simvastatin daily.  He takes one dose of fish oil and a multivitamin daily.  Patient reports good compliance with medication, good tolerance to medication, and good symptom control.    Glucose intolerance - weight is down a few pounds from last visit; due for six month recheck of glucose.    Anxiety - He is doing well emotionally.  He takes a whole dose of Lexapro daily and a Xanax at night before bed.  He has been sleeping throughout the night most nights but will sometimes wake up.  He needs refills on Lexapro and Xanax.  Hemosure positive at CPE six months ago; no evidence of anemia; colonoscopy UTD in 2010;  known hemorrhoids and does notice intermittent hemorrhoidal bleeding at times.  Due for repeat CBC.  Denies bloody stools or melenotic stools.  Review of Systems  Constitutional: Negative for fever, chills, diaphoresis, activity change, appetite change and fatigue.  Eyes: Negative for visual disturbance.  Respiratory: Negative for cough and shortness of breath.   Cardiovascular: Negative for chest pain, palpitations and leg swelling.  Gastrointestinal: Negative for nausea, vomiting, abdominal pain, diarrhea, constipation, blood in stool, anal bleeding and rectal pain.  Endocrine: Negative for cold intolerance, heat intolerance, polydipsia, polyphagia and polyuria.  Neurological: Negative for dizziness, tremors, seizures, syncope, facial asymmetry, speech difficulty, weakness, light-headedness, numbness and headaches.  Psychiatric/Behavioral: Negative for suicidal ideas, sleep disturbance, self-injury and dysphoric mood. The patient is not nervous/anxious.     Past Medical History  Diagnosis Date  . Pain in joint, shoulder region   . Other specified viral warts   . Pure hypercholesterolemia   . Genital herpes, unspecified   . Other specified inflammatory disease of prostate     outlet obstruction with BPH.Urology consult 2006 [facility]  . Unspecified hearing loss   . Insomnia, unspecified   . Encounter for therapeutic drug monitoring   . Lumbago   . Internal hemorrhoids without mention of complication   . Anxiety state, unspecified   . Unspecified vitamin D deficiency   .  Basal cell carcinoma of skin, site unspecified     L nasal; [facility].  . Genital herpes   . Depression   . Arthritis   . Internal hemorrhoids     colonoscopy 08/04/2008.  [facility].   Past Surgical History  Procedure Laterality Date  . Knee surgery  1991  . Prostate surgery  2010    for BPH  . L. facial basal cell carcinoma resection  11/2010  . Colonoscopy  08/04/2008    internal hemorrhoids; repeat in  10 years.  [facility].  . Vasectomy     No Known Allergies Current Outpatient Prescriptions  Medication Sig Dispense Refill  . acyclovir (ZOVIRAX) 800 MG tablet Take 1 tablet (800 mg total) by mouth 3 (three) times daily.  30 tablet  4  . ALPRAZolam (XANAX) 1 MG tablet Take 1 tablet (1 mg total) by mouth at bedtime as needed.  30 tablet  5  . escitalopram (LEXAPRO) 20 MG tablet Take 1 tablet (20 mg total) by mouth daily.  30 tablet  5  . indomethacin (INDOCIN) 25 MG capsule Take 1 capsule (25 mg total) by mouth 2 (two) times daily with a meal.  60 capsule  3  . Multiple Vitamin (MULTIVITAMIN) tablet Take 1 tablet by mouth daily.      . simvastatin (ZOCOR) 80 MG tablet Take 1 tablet (80 mg total) by mouth at bedtime.  30 tablet  11   No current facility-administered medications for this visit.   History   Social History  . Marital Status: Married    Spouse Name: N/A    Number of Children: 2  . Years of Education: N/A   Occupational History  . Sales    Social History Main Topics  . Smoking status: Never Smoker   . Smokeless tobacco: Never Used  . Alcohol Use: 9.0 oz/week    15 Glasses of wine per week     Comment: moderate 1-2 glasses of wine per day  . Drug Use: No     Comment: prevoiusly used marijuana  . Sexual Activity: Yes   Other Topics Concern  . Not on file   Social History Narrative   Marital status:  Married x 31 years, happily.      Children: two children; no grandchildren.      Lives: with wife, adult daughter.      Tobacco:  None       Alcohol:   2 glasses of wine per night.   Always uses seat belts. Smoke alarm in the home. Carbon monoxide detector in the home. Guns in the home not stored in locked cabinet. Exercise: Light, job very physically demanding walks a lot. Caffeine use: 2 servings Coffee per day.    Objective:   BP 112/74  Pulse 61  Temp(Src) 97.7 F (36.5 C) (Oral)  Resp 16  Ht 5' 8.75" (1.746 m)  Wt 198 lb 6.4 oz (89.994 kg)  BMI 29.52 kg/m2   SpO2 97%  Physical Exam  Nursing note and vitals reviewed. Constitutional: He is oriented to person, place, and time. He appears well-developed and well-nourished. No distress.  HENT:  Head: Normocephalic and atraumatic.  Eyes: Conjunctivae and EOM are normal. Pupils are equal, round, and reactive to light.  Neck: Normal range of motion. Neck supple. Carotid bruit is not present. No thyromegaly present.  Cardiovascular: Normal rate, regular rhythm, normal heart sounds and intact distal pulses.  Exam reveals no gallop and no friction rub.   No murmur heard. Pulmonary/Chest:  Effort normal and breath sounds normal. No respiratory distress. He has no wheezes. He has no rales.  Abdominal: Soft. Bowel sounds are normal. He exhibits no distension and no mass. There is no tenderness. There is no rebound and no guarding.  Musculoskeletal: Normal range of motion.  Lymphadenopathy:    He has no cervical adenopathy.  Neurological: He is alert and oriented to person, place, and time. No cranial nerve deficit.  Skin: Skin is warm and dry. No rash noted. He is not diaphoretic.  Psychiatric: He has a normal mood and affect. His behavior is normal.    Results for orders placed in visit on 09/05/13  CBC WITH DIFFERENTIAL      Result Value Ref Range   WBC 4.7  4.0 - 10.5 K/uL   RBC 4.91  4.22 - 5.81 MIL/uL   Hemoglobin 15.9  13.0 - 17.0 g/dL   HCT 46.2  39.0 - 52.0 %   MCV 94.1  78.0 - 100.0 fL   MCH 32.4  26.0 - 34.0 pg   MCHC 34.4  30.0 - 36.0 g/dL   RDW 12.5  11.5 - 15.5 %   Platelets 221  150 - 400 K/uL   Neutrophils Relative % 50  43 - 77 %   Neutro Abs 2.4  1.7 - 7.7 K/uL   Lymphocytes Relative 35  12 - 46 %   Lymphs Abs 1.6  0.7 - 4.0 K/uL   Monocytes Relative 9  3 - 12 %   Monocytes Absolute 0.4  0.1 - 1.0 K/uL   Eosinophils Relative 5  0 - 5 %   Eosinophils Absolute 0.2  0.0 - 0.7 K/uL   Basophils Relative 1  0 - 1 %   Basophils Absolute 0.0  0.0 - 0.1 K/uL   Smear Review Criteria  for review not met    COMPLETE METABOLIC PANEL WITH GFR      Result Value Ref Range   Sodium 138  135 - 145 mEq/L   Potassium 4.2  3.5 - 5.3 mEq/L   Chloride 103  96 - 112 mEq/L   CO2 26  19 - 32 mEq/L   Glucose, Bld 105 (*) 70 - 99 mg/dL   BUN 21  6 - 23 mg/dL   Creat 0.95  0.50 - 1.35 mg/dL   Total Bilirubin 0.5  0.2 - 1.2 mg/dL   Alkaline Phosphatase 44  39 - 117 U/L   AST 26  0 - 37 U/L   ALT 37  0 - 53 U/L   Total Protein 6.7  6.0 - 8.3 g/dL   Albumin 4.4  3.5 - 5.2 g/dL   Calcium 9.0  8.4 - 10.5 mg/dL   GFR, Est African American >89     GFR, Est Non African American >89    HEMOGLOBIN A1C      Result Value Ref Range   Hemoglobin A1C 5.8 (*) <5.7 %   Mean Plasma Glucose 120 (*) <117 mg/dL  LIPID PANEL      Result Value Ref Range   Cholesterol 176  0 - 200 mg/dL   Triglycerides 205 (*) <150 mg/dL   HDL 44  >39 mg/dL   Total CHOL/HDL Ratio 4.0     VLDL 41 (*) 0 - 40 mg/dL   LDL Cholesterol 91  0 - 99 mg/dL  PSA      Result Value Ref Range   PSA 0.42  <=4.00 ng/mL  TSH      Result  Value Ref Range   TSH 4.293  0.350 - 4.500 uIU/mL  POCT URINALYSIS DIPSTICK      Result Value Ref Range   Color, UA yellow     Clarity, UA clear     Glucose, UA neg     Bilirubin, UA neg     Ketones, UA neg     Spec Grav, UA 1.025     Blood, UA neg     pH, UA 5.5     Protein, UA neg     Urobilinogen, UA 0.2     Nitrite, UA neg     Leukocytes, UA Negative    IFOBT (OCCULT BLOOD)      Result Value Ref Range   IFOBT Positive      Assessment & Plan:   1. Generalized anxiety disorder   2. Pure hypercholesterolemia   3. Other abnormal glucose   4. Insomnia    1. Generalized anxiety disorder: controlled; refill of Lexapro and Xanax today; no change in management. 2.  Hypercholesterolemia: controlled; elevated triglycerides only; pt currently taking one Fish Oil daily; consider increasing to two Fish Oil daily.   3.  Glucose Intolerance:  Stable; obtain labs.   4. Insomnia:  controlled with Xanax qhs.  Meds ordered this encounter  Medications  . escitalopram (LEXAPRO) 20 MG tablet    Sig: Take 1 tablet (20 mg total) by mouth daily.    Dispense:  30 tablet    Refill:  5  . DISCONTD: ALPRAZolam (XANAX) 1 MG tablet    Sig: Take 1 tablet (1 mg total) by mouth at bedtime as needed.    Dispense:  30 tablet    Refill:  5  . ALPRAZolam (XANAX) 1 MG tablet    Sig: Take 1 tablet (1 mg total) by mouth at bedtime as needed.    Dispense:  30 tablet    Refill:  5    Return in about 6 months (around 09/06/2014) for complete physical examiniation.    I personally performed the services described in this documentation, which was scribed in my presence.  The recorded information has been reviewed and is accurate.  John Preston, M.D.  [facility] [address] [place], [place]  [address] [phone] phone [phone] fax

## 2014-03-10 ENCOUNTER — Ambulatory Visit (INDEPENDENT_AMBULATORY_CARE_PROVIDER_SITE_OTHER): Payer: PRIVATE HEALTH INSURANCE | Admitting: Family Medicine

## 2014-03-10 VITALS — BP 112/72 | HR 57 | Temp 97.6°F | Resp 16 | Ht 69.0 in | Wt 197.0 lb

## 2014-03-10 DIAGNOSIS — R110 Nausea: Secondary | ICD-10-CM

## 2014-03-10 DIAGNOSIS — G43111 Migraine with aura, intractable, with status migrainosus: Secondary | ICD-10-CM

## 2014-03-10 MED ORDER — OXYCODONE-ACETAMINOPHEN 5-325 MG PO TABS: 1.0000 | ORAL_TABLET | Freq: Four times a day (QID) | ORAL | Status: DC | PRN

## 2014-03-10 MED ORDER — ONDANSETRON 4 MG PO TBDP
4.0000 mg | ORAL_TABLET | Freq: Once | ORAL | Status: AC
Start: 2014-03-10 — End: 2014-03-10
  Administered 2014-03-10: 4 mg via ORAL

## 2014-03-10 MED ORDER — KETOROLAC TROMETHAMINE 60 MG/2ML IM SOLN
60.0000 mg | Freq: Once | INTRAMUSCULAR | Status: AC
  Administered 2014-03-10: 60 mg via INTRAMUSCULAR

## 2014-03-10 MED ORDER — SUMATRIPTAN SUCCINATE 50 MG PO TABS: 50.0000 mg | ORAL_TABLET | Freq: Four times a day (QID) | ORAL | Status: DC | PRN

## 2014-03-10 NOTE — Progress Notes (Signed)
 Chief Complaint:  Chief Complaint  Patient presents with  . Migraine    X 3 days    HPI: John Preston is a 58 y.o. male who is here for a 3 day history of migraine headache, nausea, he has had migraines before with similar sxs. Never has iit lasted this long usually 12-24 hours  He has light and noise sensitiviies, similar to prior HA. He tried otc meds without releif.  Sister has migraines and she takes imitrex for it and it gives her relief. He never has had to take anything stronger.  Had some nausea. He has no stroke sxs, he does have some eye pain but it is on both sideds and nothing is assymetric.  He has not thrown up, He has tried excedrin without releif.   Past Medical History  Diagnosis Date  . Pain in joint, shoulder region   . Other specified viral warts   . Pure hypercholesterolemia   . Genital herpes, unspecified   . Other specified inflammatory disease of prostate     outlet obstruction with BPH.Urology consult 2006 [facility]  . Unspecified hearing loss   . Insomnia, unspecified   . Encounter for therapeutic drug monitoring   . Lumbago   . Internal hemorrhoids without mention of complication   . Anxiety state, unspecified   . Unspecified vitamin D deficiency   . Basal cell carcinoma of skin, site unspecified     L nasal; [facility].  . Genital herpes   . Depression   . Arthritis   . Internal hemorrhoids     colonoscopy 08/04/2008.  [facility].   Past Surgical History  Procedure Laterality Date  . Knee surgery  1991  . Prostate surgery  2010    for BPH  . L. facial basal cell carcinoma resection  11/2010  . Colonoscopy  08/04/2008    internal hemorrhoids; repeat in 10 years.  [facility].  . Vasectomy     History   Social History  . Marital Status: Married    Spouse Name: N/A    Number of Children: 2  . Years of Education: N/A   Occupational History  . Sales    Social History Main Topics  . Smoking status: Never Smoker   . Smokeless  tobacco: Never Used  . Alcohol Use: 9.0 oz/week    15 Glasses of wine per week     Comment: moderate 1-2 glasses of wine per day  . Drug Use: No     Comment: prevoiusly used marijuana  . Sexual Activity: Yes   Other Topics Concern  . None   Social History Narrative   Marital status:  Married x 31 years, happily.      Children: two children; no grandchildren.      Lives: with wife, adult daughter.      Tobacco:  None       Alcohol:   2 glasses of wine per night.   Always uses seat belts. Smoke alarm in the home. Carbon monoxide detector in the home. Guns in the home not stored in locked cabinet. Exercise: Light, job very physically demanding walks a lot. Caffeine use: 2 servings Coffee per day.   Family History  Problem Relation Age of Onset  . COPD Mother   . Diabetes Mother   . Heart disease Mother 58    cardiac stenting/CAD; no AMI  . Cancer Maternal Grandmother   . Diabetes Maternal Grandmother   . Lung disease  Oxygen dependent  . Tics Father   . Dementia Father   . Mental illness Father    No Known Allergies Prior to Admission medications   Medication Sig Start Date End Date Taking? Authorizing Provider  acyclovir (ZOVIRAX) 800 MG tablet Take 1 tablet (800 mg total) by mouth 3 (three) times daily. 06/28/12  Yes Wardell Honour, MD  ALPRAZolam Duanne Moron) 1 MG tablet Take 1 tablet (1 mg total) by mouth at bedtime as needed. 03/06/14  Yes Wardell Honour, MD  escitalopram (LEXAPRO) 20 MG tablet Take 1 tablet (20 mg total) by mouth daily. 03/06/14  Yes Wardell Honour, MD  indomethacin (INDOCIN) 25 MG capsule Take 1 capsule (25 mg total) by mouth 2 (two) times daily with a meal. 09/05/13  Yes Wardell Honour, MD  Multiple Vitamin (MULTIVITAMIN) tablet Take 1 tablet by mouth daily.   Yes Historical Provider, MD  simvastatin (ZOCOR) 80 MG tablet Take 1 tablet (80 mg total) by mouth at bedtime. 09/05/13  Yes Wardell Honour, MD     ROS: The patient denies fevers, chills, night sweats,  unintentional weight loss, chest pain, palpitations, wheezing, dyspnea on exertion, nausea, vomiting, abdominal pain, dysuria, hematuria, melena, numbness, weakness, or tingling.   All other systems have been reviewed and were otherwise negative with the exception of those mentioned in the HPI and as above.    PHYSICAL EXAM: Filed Vitals:   03/10/14 0923  BP: 112/72  Pulse: 57  Temp: 97.6 F (36.4 C)  Resp: 16   Filed Vitals:   03/10/14 0923  Height: _0  (1.753 m)  Weight: 197 lb (89.359 kg)   Body mass index is 29.08 kg/(m^2).  General: Alert, no acute distress HEENT:  Normocephalic, atraumatic, oropharynx patent. EOMI, PERRLA, fundo exam normal, light sensitivy Cardiovascular:  Regular rate and rhythm, no rubs murmurs or gallops.  No, Carotid bruits, radial pulse intact. No pedal edema.  Respiratory: Clear to auscultation bilaterally.  No wheezes, rales, or rhonchi.  No cyanosis, no use of accessory musculature GI: No organomegaly, abdomen is soft and non-tender, positive bowel sounds.  No masses. Skin: No rashes. Neurologic: Facial musculature symmetric. CN 2-12 grossly noral Psychiatric: Patient is appropriate throughout our interaction. Lymphatic: No cervical lymphadenopathy Musculoskeletal: Gait intact.   LABS: Results for orders placed in visit on 03/06/14  CBC WITH DIFFERENTIAL      Result Value Ref Range   WBC 4.5  4.0 - 10.5 K/uL   RBC 4.58  4.22 - 5.81 MIL/uL   Hemoglobin 14.9  13.0 - 17.0 g/dL   HCT 42.2  39.0 - 52.0 %   MCV 92.1  78.0 - 100.0 fL   MCH 32.5  26.0 - 34.0 pg   MCHC 35.3  30.0 - 36.0 g/dL   RDW 12.8  11.5 - 15.5 %   Platelets 250  150 - 400 K/uL   Neutrophils Relative % 53  43 - 77 %   Neutro Abs 2.4  1.7 - 7.7 K/uL   Lymphocytes Relative 31  12 - 46 %   Lymphs Abs 1.4  0.7 - 4.0 K/uL   Monocytes Relative 9  3 - 12 %   Monocytes Absolute 0.4  0.1 - 1.0 K/uL   Eosinophils Relative 6 (*) 0 - 5 %   Eosinophils Absolute 0.3  0.0 - 0.7 K/uL     Basophils Relative 1  0 - 1 %   Basophils Absolute 0.0  0.0 - 0.1 K/uL   Smear  Review Criteria for review not met    COMPTE METABOLIC PANEL WITH GFR      Result Value Ref Range   Sodium 139  135 - 145 mEq/L   Potassium 3.9  3.5 - 5.3 mEq/L   Chloride 104  96 - 112 mEq/L   CO2 25  19 - 32 mEq/L   Glucose, Bld 105 (*) 70 - 99 mg/dL   BUN 11  6 - 23 mg/dL   Creat 0.94  0.50 - 1.35 mg/dL   Total Bilirubin 0.5  0.2 - 1.2 mg/dL   Alkaline Phosphatase 45  39 - 117 U/L   AST 24  0 - 37 U/L   ALT 40  0 - 53 U/L   Total Protein 6.3  6.0 - 8.3 g/dL   Albumin 4.2  3.5 - 5.2 g/dL   Calcium 8.7  8.4 - 10.5 mg/dL   GFR, Est African American >89     GFR, Est Non African American >89    HEMOGLOBIN A1C      Result Value Ref Range   Hemoglobin A1C 5.5  <5.7 %   Mean Plasma Glucose 111  <117 mg/dL  LIPID PANEL      Result Value Ref Range   Cholesterol 168  0 - 200 mg/dL   Triglycerides 224 (*) <150 mg/dL   HDL 46  >39 mg/dL   Total CHOL/HDL Ratio 3.7     VLDL 45 (*) 0 - 40 mg/dL   LDL Cholesterol 77  0 - 99 mg/dL     EKG/XRAY:   Primary read interpreted by Dr. Marin Comment at [facility].   ASSESSMENT/PLAN: Encounter Diagnoses  Name Primary?  . Intractable migraine with aura with status migrainosus Yes  . Nausea alone    Typical migraine sxs He will return during the weekend if no sxs relief with percocet and imitrex prn He was not able to get a ride today , lives in [place] so I was not comfortable with him getting [place] nd driving home He has precautions to go to ER Received IVF x 1L  and toradol 60 mg x 1 and also zofran without releif.    Gross sideeffects, risk and benefits, and alternatives of medications d/w patient. Patient is aware that all medications have potential sideeffects and we are unable to predict every sideeffect or drug-drug interaction that may occur.  , Gerber, DO 03/10/2014 11:54 AM

## 2014-03-10 NOTE — Patient Instructions (Signed)
Migraine Headache A migraine headache is an intense, throbbing pain on one or both sides of your head. A migraine can last for 30 minutes to several hours. CAUSES  The exact cause of a migraine headache is not always known. However, a migraine may be caused when nerves in the brain become irritated and release chemicals that cause inflammation. This causes pain. Certain things may also trigger migraines, such as:  Alcohol.  Smoking.  Stress.  Menstruation.  Aged cheeses.  Foods or drinks that contain nitrates, glutamate, aspartame, or tyramine.  Lack of sleep.  Chocolate.  Caffeine.  Hunger.  Physical exertion.  Fatigue.  Medicines used to treat chest pain (nitroglycerine), birth control pills, estrogen, and some blood pressure medicines. SIGNS AND SYMPTOMS  Pain on one or both sides of your head.  Pulsating or throbbing pain.  Severe pain that prevents daily activities.  Pain that is aggravated by any physical activity.  Nausea, vomiting, or both.  Dizziness.  Pain with exposure to bright lights, loud noises, or activity.  General sensitivity to bright lights, loud noises, or smells. Before you get a migraine, you may get warning signs that a migraine is coming (aura). An aura may include:  Seeing flashing lights.  Seeing bright spots, halos, or zigzag lines.  Having tunnel vision or blurred vision.  Having feelings of numbness or tingling.  Having trouble talking.  Having muscle weakness. DIAGNOSIS  A migraine headache is often diagnosed based on:  Symptoms.  Physical exam.  A CT scan or MRI of your head. These imaging tests cannot diagnose migraines, but they can help rule out other causes of headaches. TREATMENT Medicines may be given for pain and nausea. Medicines can also be given to help prevent recurrent migraines.  HOME CARE INSTRUCTIONS  Only take over-the-counter or prescription medicines for pain or discomfort as directed by your  health care provider. The use of long-term narcotics is not recommended.  Lie down in a dark, quiet room when you have a migraine.  Keep a journal to find out what may trigger your migraine headaches. For example, write down:  What you eat and drink.  How much sleep you get.  Any change to your diet or medicines.  Limit alcohol consumption.  Quit smoking if you smoke.  Get 7-9 hours of sleep, or as recommended by your health care provider.  Limit stress.  Keep lights dim if bright lights bother you and make your migraines worse. SEEK IMMEDIATE MEDICAL CARE IF:   Your migraine becomes severe.  You have a fever.  You have a stiff neck.  You have vision loss.  You have muscular weakness or loss of muscle control.  You start losing your balance or have trouble walking.  You feel faint or pass out.  You have severe symptoms that are different from your first symptoms. MAKE SURE YOU:   Understand these instructions.  Will watch your condition.  Will get help right away if you are not doing well or get worse. Document Released: 07/21/2005 Document Revised: 12/05/2013 Document Reviewed: 03/28/2013 ExitCare Patient Information 2015 [employer]. This information is not intended to replace advice given to you by your health care provider. Make sure you discuss any questions you have with your health care provider.  

## 2014-04-25 ENCOUNTER — Telehealth: Payer: Self-pay

## 2014-04-25 NOTE — Telephone Encounter (Signed)
Patients spouse is calling in behalf of husband to request a sleep study for him. His pcp is Dr. Tamala Julian. They would like to ask her if she would recommend a sleep study location. They have [place] sleep study in mind but patient works in [employer].   Please advise: [phone]

## 2014-04-25 NOTE — Telephone Encounter (Signed)
Pt's wife said that pt has been snoring for a while now, HA's recently, cough x 3 months, very fatigued. Wants to r/o sleep apnea.

## 2014-04-25 NOTE — Telephone Encounter (Signed)
Please call and clarify --- why does patient need a sleep study?

## 2014-04-25 NOTE — Telephone Encounter (Signed)
Dr Tamala Julian please advise if we are able to refer pt for a sleep study.

## 2014-04-26 NOTE — Telephone Encounter (Signed)
Dr. Tamala Julian,   Patients spouse called again to check status of referral. I said it takes time for [place] to work on the referral - no refreral put in yet- I told her Dr. Tamala Julian needs to review the message previously and if he is worse he needs to come in to the office. Patients spouse says he has a headache and had to stay home and missed work - that he is worse. She says they are trying to avoid cost of coming into office to be seen for a referral.

## 2014-04-27 ENCOUNTER — Other Ambulatory Visit: Payer: Self-pay | Admitting: Family Medicine

## 2014-04-27 ENCOUNTER — Telehealth: Payer: Self-pay

## 2014-04-27 ENCOUNTER — Ambulatory Visit (INDEPENDENT_AMBULATORY_CARE_PROVIDER_SITE_OTHER): Payer: PRIVATE HEALTH INSURANCE | Admitting: Family Medicine

## 2014-04-27 VITALS — BP 128/78 | HR 64 | Temp 97.8°F | Resp 18 | Ht 69.5 in | Wt 199.2 lb

## 2014-04-27 DIAGNOSIS — R51 Headache: Secondary | ICD-10-CM

## 2014-04-27 DIAGNOSIS — R6889 Other general symptoms and signs: Secondary | ICD-10-CM

## 2014-04-27 DIAGNOSIS — Z23 Encounter for immunization: Secondary | ICD-10-CM

## 2014-04-27 DIAGNOSIS — R0683 Snoring: Secondary | ICD-10-CM

## 2014-04-27 DIAGNOSIS — R0609 Other forms of dyspnea: Secondary | ICD-10-CM

## 2014-04-27 DIAGNOSIS — J069 Acute upper respiratory infection, unspecified: Secondary | ICD-10-CM

## 2014-04-27 DIAGNOSIS — R0989 Other specified symptoms and signs involving the circulatory and respiratory systems: Secondary | ICD-10-CM

## 2014-04-27 DIAGNOSIS — G4710 Hypersomnia, unspecified: Secondary | ICD-10-CM

## 2014-04-27 LAB — POCT CBC
GRANULOCYTE PERCENT: 59.1 % (ref 37–80)
HCT, POC: 50.3 % (ref 43.5–53.7)
Hemoglobin: 16.4 g/dL (ref 14.1–18.1)
Lymph, poc: 2.6 (ref 0.6–3.4)
MCH, POC: 31.6 pg — AB (ref 27–31.2)
MCHC: 32.6 g/dL (ref 31.8–35.4)
MCV: 97.1 fL — AB (ref 80–97)
MID (CBC): 0.5 (ref 0–0.9)
MPV: 6.8 fL (ref 0–99.8)
PLATELET COUNT, POC: 259 10*3/uL (ref 142–424)
POC Granulocyte: 4.5 (ref 2–6.9)
POC LYMPH PERCENT: 34.2 %L (ref 10–50)
POC MID %: 6.7 %M (ref 0–12)
RBC: 5.18 M/uL (ref 4.69–6.13)
RDW, POC: 12.5 %
WBC: 7.6 10*3/uL (ref 4.6–10.2)

## 2014-04-27 MED ORDER — PREDNISONE 20 MG PO TABS: 20.0000 mg | ORAL_TABLET | Freq: Two times a day (BID) | ORAL | Status: DC

## 2014-04-27 MED ORDER — FLUTICASONE PROPIONATE 50 MCG/ACT NA SUSP: 2.0000 | Freq: Every day | NASAL | Status: DC

## 2014-04-27 MED ORDER — AMOXICILLIN 500 MG PO CAPS: 1000.0000 mg | ORAL_CAPSULE | Freq: Two times a day (BID) | ORAL | Status: DC

## 2014-04-27 NOTE — Progress Notes (Signed)
Subjective:  This chart was scribed for [name], MD by Mercy Moore, Medial Scribe. This patient was seen in room 1 and the patient's care was started at 8:15 PM.   Patient ID: John Preston, male    DOB: 02-26-1956, 58 y.o.   MRN: [id number]  04/27/2014  Headache, Fatigue, Cough and Flu Vaccine  HPI HPI Comments: John Preston is a 58 y.o. male with history of migraines who presents to the [facility] complaining of daily mild headaches that have been ongoing for six weeks. Patient last seen on 03/10/14 by Dr. Marin Comment for migraine headache and nausea. Patient has history of migraines, but they have never lasted as long as recent migraine. Patient reports audio/photobia, eye pain and nausea, but denies vomiting. Patient prescribed Percocet and Imitrex with resolution of migraine HA. He was given IV fluids and Toradol 60 mg.   Patient reports that recently he has been so tired that he has not wanted to get out of bed. Yesterday his fatigue was so extreme he was unable to get out of bed and go to work. Today he only worked a half day. Patient reports hacking cough dissimilar to acid reflux and his wife reports snoring at night. Patient reports that he is awakened by his own snoring. Patient suspects that he may have sleep apnea; he reports day time fatigue and need for a power nap on his lunch break almost daily. Patient desires referral for a sleep study. Patient reports going to bed at 9pm each night. He denies issues falling asleep; he takes Xanax before bed. Patient gets plenty of sleep, but he reports waking up frequently during the night and he is not always able to get back to sleep. Patient denies caffeine at dinner or before bed. Pt awakens at 6:00am during the week but will sleep until noon on the weekends.  Patient mentions tingling in the second digit of his right foot at base of 2nd digit. Patient denies pain at rest, but reports soreness with prolonged standing and  increased activity. Patient has not taken any anti inflammatory medication or icing.   Patient reports cold symptoms that resolved one week ago. Cold initiated as a sore throat which only lasted a single day. He then reports plugged ears, headache, cough and congestion that lasted two weeks; cold was quite severe. Patient reports lingering throat clearing that has persisted since resolution of his cold symptoms.   Denies any stressors. Daughter just moved out and has a boyfriend.  Work is stable.  Personal life is stable.  Paid mortgage off in past year.  Feels emotionally well at this time.  Compliance with Lexapro and Xanax.    Review of Systems  Constitutional: Positive for fatigue. Negative for fever, chills, diaphoresis, activity change, appetite change and unexpected weight change.  HENT: Positive for congestion and postnasal drip. Negative for rhinorrhea, sinus pressure, sore throat, trouble swallowing and voice change.   Eyes: Negative for photophobia, redness and visual disturbance.       Denies blurred vision.  Respiratory: Positive for cough. Negative for shortness of breath and stridor.   Cardiovascular: Negative for chest pain, palpitations and leg swelling.  Gastrointestinal: Negative for nausea, vomiting, abdominal pain and diarrhea.  Musculoskeletal: Positive for arthralgias. Negative for joint swelling and neck pain.  Skin: Negative for rash.  Neurological: Positive for numbness and headaches. Negative for dizziness, tremors, seizures, syncope, speech difficulty and light-headedness.  Psychiatric/Behavioral: Negative for suicidal ideas, sleep disturbance, self-injury and  dysphoric mood. The patient is not nervous/anxious.     Past Medical History  Diagnosis Date  . Pain in joint, shoulder region   . Other specified viral warts   . Pure hypercholesterolemia   . Genital herpes, unspecified   . Other specified inflammatory disease of prostate     outlet obstruction with  BPH.Urology consult 2006 [facility]  . Unspecified hearing loss   . Insomnia, unspecified   . Encounter for therapeutic drug monitoring   . Lumbago   . Internal hemorrhoids without mention of complication   . Anxiety state, unspecified   . Unspecified vitamin D deficiency   . Basal cell carcinoma of skin, site unspecified     L nasal; [facility].  . Genital herpes   . Depression   . Arthritis   . Internal hemorrhoids     colonoscopy 08/04/2008.  [facility].   Past Surgical History  Procedure Laterality Date  . Knee surgery  1991  . Prostate surgery  2010    for BPH  . L. facial basal cell carcinoma resection  11/2010  . Colonoscopy  08/04/2008    internal hemorrhoids; repeat in 10 years.  [facility].  . Vasectomy     No Known Allergies Current Outpatient Prescriptions  Medication Sig Dispense Refill  . ALPRAZolam (XANAX) 1 MG tablet Take 1 tablet (1 mg total) by mouth at bedtime as needed.  30 tablet  5  . escitalopram (LEXAPRO) 20 MG tablet Take 1 tablet (20 mg total) by mouth daily.  30 tablet  5  . Multiple Vitamin (MULTIVITAMIN) tablet Take 1 tablet by mouth daily.      Marland Kitchen oxyCODONE-acetaminophen (ROXICET) 5-325 MG per tablet Take 1-2 tablets by mouth every 6 (six) hours as needed for severe pain. Take stool softener with this as needed  30 tablet  0  . simvastatin (ZOCOR) 80 MG tablet Take 40 mg by mouth at bedtime.      . SUMAtriptan (IMITREX) 50 MG tablet Take 1 tablet (50 mg total) by mouth 4 (four) times daily as needed for migraine or headache. May repeat in 2 hours after 1st dose  if headache persists or recurs. Not to exceed 300 mg in 24 hours  10 tablet  5  . acyclovir (ZOVIRAX) 800 MG tablet Take 1 tablet (800 mg total) by mouth 3 (three) times daily.  30 tablet  4  . amoxicillin (AMOXIL) 500 MG capsule Take 2 capsules (1,000 mg total) by mouth 2 (two) times daily.  40 capsule  0  . fluticasone (FLONASE) 50 MCG/ACT nasal spray Place 2 sprays into both nostrils daily.  16 g   6  . indomethacin (INDOCIN) 25 MG capsule Take 1 capsule (25 mg total) by mouth 2 (two) times daily with a meal.  60 capsule  3  . predniSONE (DELTASONE) 20 MG tablet Take 1 tablet (20 mg total) by mouth 2 (two) times daily with a meal.  10 tablet  0   No current facility-administered medications for this visit.       Objective:    BP 128/78  Pulse 64  Temp(Src) 97.8 F (36.6 C) (Oral)  Resp 18  SpO2 99%  Physical Exam  Nursing note and vitals reviewed. Constitutional: He is oriented to person, place, and time. He appears well-developed and well-nourished. No distress.  HENT:  Head: Normocephalic and atraumatic.  Right Ear: External ear normal.  Left Ear: External ear normal.  Nose: Nose normal.  Mouth/Throat: Oropharynx is clear and moist.  Eyes: Conjunctivae and EOM are normal. Pupils are equal, round, and reactive to light.  Neck: Normal range of motion. Neck supple. Carotid bruit is not present. No thyromegaly present.  Cardiovascular: Normal rate, regular rhythm, normal heart sounds and intact distal pulses.  Exam reveals no gallop and no friction rub.   No murmur heard. Pulmonary/Chest: Effort normal and breath sounds normal. No respiratory distress. He has no wheezes. He has no rales.  Abdominal: Soft. Bowel sounds are normal. He exhibits no distension and no mass. There is no tenderness. There is no rebound and no guarding.  Musculoskeletal: Normal range of motion.  Lymphadenopathy:    He has no cervical adenopathy.  Neurological: He is alert and oriented to person, place, and time. No cranial nerve deficit.  Skin: Skin is warm and dry. No rash noted. He is not diaphoretic.  Psychiatric: He has a normal mood and affect. His behavior is normal.        Assessment & Plan:   1. Need for prophylactic vaccination and inoculation against influenza   2. Hypersomnolence   3. Snoring   4. Headache(784.0)   5. Throat clearing   6. Acute upper respiratory infections of  unspecified site     1. Hypersomnolence:  New.  Associated with snoring, headache. Refer for sleep study. Obtain TSH. 2.  Snoring:  New.  Associated with hypersomnolence. 3.  Headache:  New.  Daily for past six weeks; normal neurological exam. Obtain labs.  Treat for sinusitis considering recent severe cold; rx for Amoxicillin, Prednisone provided.  Rx for Flonase provided. 4.  Throat clearing:  New.  Ddx includes allergic rhinitis PND versus silent GERD.  Treat with Flonase daily. 5.  URI/acute sinusitis: New. Obtain EBV titers with fatigue, daily headache.  Treat with Amoxicillin and Prednisone. 5.  S/p flu vaccine. 6. Migraine: resolved since last visit; now with lingering mild headache.  Normal neurological exam.   Meds ordered this encounter  Medications  . simvastatin (ZOCOR) 80 MG tablet    Sig: Take 40 mg by mouth at bedtime.  . fluticasone (FLONASE) 50 MCG/ACT nasal spray    Sig: Place 2 sprays into both nostrils daily.    Dispense:  16 g    Refill:  6  . amoxicillin (AMOXIL) 500 MG capsule    Sig: Take 2 capsules (1,000 mg total) by mouth 2 (two) times daily.    Dispense:  40 capsule    Refill:  0  . predniSONE (DELTASONE) 20 MG tablet    Sig: Take 1 tablet (20 mg total) by mouth 2 (two) times daily with a meal.    Dispense:  10 tablet    Refill:  0    No Follow-up on file.  I personally performed the services described in this documentation, which was scribed in my presence. The recorded information has been reviewed and is accurate.  [name], M.D.  [facility] [address] [place], [place]  [phone] phone [phone] fax

## 2014-04-27 NOTE — Telephone Encounter (Addendum)
Per [name] pt needs to come in office for evaluation. Pt has been having migraines lately so I told him he needs to come in for a full evaluation to make sure we make the proper referral.   Pt is coming in after 4 today to see Dr. Tamala Julian.

## 2014-04-27 NOTE — Telephone Encounter (Signed)
Pt is checking on status of referral and is hoping that a pa could look at this since the schedule shows that dr Tamala Julian is not back til Monday

## 2014-04-28 LAB — COMPREHENSIVE METABOLIC PANEL
ALK PHOS: 55 U/L (ref 39–117)
ALT: 60 U/L — AB (ref 0–53)
AST: 36 U/L (ref 0–37)
Albumin: 4.6 g/dL (ref 3.5–5.2)
BILIRUBIN TOTAL: 0.6 mg/dL (ref 0.2–1.2)
BUN: 14 mg/dL (ref 6–23)
CALCIUM: 9.8 mg/dL (ref 8.4–10.5)
CHLORIDE: 101 meq/L (ref 96–112)
CO2: 30 mEq/L (ref 19–32)
CREATININE: 0.98 mg/dL (ref 0.50–1.35)
Glucose, Bld: 99 mg/dL (ref 70–99)
Potassium: 4.4 mEq/L (ref 3.5–5.3)
Sodium: 140 mEq/L (ref 135–145)
Total Protein: 7.7 g/dL (ref 6.0–8.3)

## 2014-04-28 LAB — TSH: TSH: 5.417 u[IU]/mL — AB (ref 0.350–4.500)

## 2014-04-29 LAB — EPSTEIN-BARR VIRUS VCA ANTIBODY PANEL
EBV EA IgG: 7.4 U/mL (ref <9.0)
EBV NA IGG: 24.7 U/mL — AB (ref <18.0)
EBV VCA IgG: 189 U/mL — ABNORMAL HIGH (ref <18.0)
EBV VCA IgM: <10 U/mL (ref <36.0)

## 2014-05-03 LAB — VITAMIN B12: Vitamin B-12: 606 pg/mL (ref 211–911)

## 2014-05-03 LAB — FOLATE

## 2014-05-15 ENCOUNTER — Institutional Professional Consult (permissible substitution): Payer: PRIVATE HEALTH INSURANCE | Admitting: Neurology

## 2014-05-16 NOTE — Telephone Encounter (Signed)
Error

## 2014-06-22 ENCOUNTER — Encounter: Payer: Self-pay | Admitting: Neurology

## 2014-06-22 ENCOUNTER — Ambulatory Visit (INDEPENDENT_AMBULATORY_CARE_PROVIDER_SITE_OTHER): Payer: PRIVATE HEALTH INSURANCE | Admitting: Neurology

## 2014-06-22 VITALS — BP 144/84 | HR 76 | Resp 14 | Ht 69.5 in | Wt 206.0 lb

## 2014-06-22 DIAGNOSIS — G478 Other sleep disorders: Secondary | ICD-10-CM

## 2014-06-22 DIAGNOSIS — R51 Headache: Secondary | ICD-10-CM

## 2014-06-22 DIAGNOSIS — R519 Headache, unspecified: Secondary | ICD-10-CM

## 2014-06-22 HISTORY — DX: Headache, unspecified: R51.9

## 2014-06-22 HISTORY — DX: Other sleep disorders: G47.8

## 2014-06-22 NOTE — Progress Notes (Signed)
SLEEP MEDICINE CLINIC   Provider:  Larey Seat, M D  Referring Provider: Wardell Honour, MD Primary Care Physician:  [name], MD  Chief Complaint  Patient presents with  . NP Sleep Tamala Julian)    Rm 10, alone    HPI:  John Preston is a 58 y.o. caucasian, married, right handed  male , seen here as a referral from Dr. Tamala Julian for a sleep evaluation.   Dear Dr. Tamala Julian,   Thank you for entrusting your patient , John Preston , to my care.  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 He has been unable to enjoy restorative sleep from many month, likely a couple of years. He noted a progression i fatigue and excessive sleepiness over th last 18 month.   His wife reports him to snore, but not to have apnea. He has frequent morning headaches, on occasion he has been woken by headaches. These HA last almost all day, resolve at dinner time.  He was treated with Imitrex after his sister reported good success with her migrainous headaches. It did not help him, and neither did Indocin.  He has throbbing headaches one side, left temple or forehead or behind the left eye, he has mild phono- and photophobia and has tunnel vision with Headaches. 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   He feel every morning as if he hadn't slept enough, is unrefreshed and unrestored. He struggles through his day at work and l takes a power nap at 11 .30 ( lunch break )  in his car for 30-40  minutes, until waking from sleep choking. He does not experience these when sleeping  in bed, in supine.  He wakes up with a dry mouth , and HA. The nap is not more refreshing that the nocturnal sleep/  His bed time is around around 9 PM oldest the patient and his wife enjoy watching TV in the bedroom for about an hour. He will usually be asleep between 10 and 11 PM, his wife reports him to snore but has not witnessed apneas per se.  The patient had undergone prostate surgery for the treatment of medication nonresponsive hypertrophy and he has 1-2 nocturia is at night. Usually, he goes back to sleep.  He rises at 6 AM and spontaneoulsy wakes in time, at 5.30 AM. He has 1 mug of coffee in AM for breakfast, and drinks in on his way to work, takes his medication in th his car , too.  He often has a post nasal drip and takes allergy medication. He has been taking Lexapro and xanax for mood and sleep improvement.   He drinks alcohol daily, 2 glasses beer or wine  and this feels good , relaxing.   Review of Systems: Out of a complete 14 system review, the patient complains of only the following symptoms, and all other reviewed systems are negative. He endorsed fatigue, light sensitivity, eye redness and itching, hearing loss, headaches and depression, insomnia sometimes and witnessed snoring.  Epworth score 9 , Fatigue severity score 32   , depression score was not obtained.    History   Social History  . Marital Status: Married    Spouse Name: N/A    Number of Children: 2  . Years of Education: N/A   Occupational History  . Sales    Social History Main Topics  . Smoking  status: Never Smoker   . Smokeless tobacco: Never Used  . Alcohol Use: 9.0 oz/week    15 Glasses of wine per week     Comment: moderate 1-2 glasses of wine per day  . Drug Use: No     Comment: prevoiusly used marijuana  . Sexual Activity: Yes   Other Topics Concern  . Not on file   Social History Narrative   Marital status:  Married x 31 years, happily.      Children: two children; no grandchildren.      Lives: with wife, adult daughter.      Tobacco:  None       Alcohol:   2 glasses of wine per night.   Always uses seat belts. Smoke alarm in the home. Carbon monoxide detector in the home. Guns in the home not stored in locked cabinet. Exercise: Light, job very physically demanding walks a lot. Caffeine use: 2 servings Coffee per day.     Family History  Problem Relation Age of Onset  . COPD Mother   . Diabetes Mother   . Heart disease Mother 61    cardiac stenting/CAD; no AMI  . Cancer Maternal Grandmother   . Diabetes Maternal Grandmother   . Lung disease      Oxygen dependent  . Tics Father   . Dementia Father   . Mental illness Father     Past Medical History  Diagnosis Date  . Pain in joint, shoulder region   . Other specified viral warts   . Pure hypercholesterolemia   . Genital herpes, unspecified   . Other specified inflammatory disease of prostate     outlet obstruction with BPH.Urology consult 2006 [facility]  . Unspecified hearing loss   . Insomnia, unspecified   . Encounter for therapeutic drug monitoring   . Lumbago   . Internal hemorrhoids without mention of complication   . Anxiety state, unspecified   . Unspecified vitamin D deficiency   . Basal cell carcinoma of skin, site unspecified     L nasal; [facility].  . Genital herpes   . Depression   . Arthritis   . Internal hemorrhoids     colonoscopy 08/04/2008.  [facility].  . 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 Sleep related headaches 06/22/2014    Past Surgical History  Procedure Laterality Date  . Knee surgery  1991  . Prostate surgery  2010    for BPH  . L. facial basal cell carcinoma resection  11/2010  . Colonoscopy  08/04/2008    internal hemorrhoids; repeat in 10 years.  [facility].  . Vasectomy      Current Outpatient Prescriptions  Medication Sig Dispense Refill  . ALPRAZolam (XANAX) 1 MG tablet Take 1 tablet (1 mg total) by mouth at bedtime as needed. 30 tablet 5  . escitalopram (LEXAPRO) 20 MG tablet Take 1 tablet (20 mg total) by mouth daily. 30 tablet 5  . indomethacin (INDOCIN) 25 MG capsule Take 1 capsule (25 mg total) by mouth 2 (two) times daily with a meal. 60 capsule 3  . Multiple Vitamin (MULTIVITAMIN) tablet Take 1 tablet by mouth daily.    . simvastatin (ZOCOR) 80 MG tablet Take 40 mg by mouth at bedtime.     . SUMAtriptan (IMITREX) 50 MG tablet Take 1 tablet (50 mg total) by mouth 4 (four) times daily as needed for migraine or headache. May repeat in 2 hours after 1st dose  if headache persists or recurs. Not to exceed 300  mg in 24 hours 10 tablet 5  . acyclovir (ZOVIRAX) 800 MG tablet Take 1 tablet (800 mg total) by mouth 3 (three) times daily. 30 tablet 4  . fluticasone (FLONASE) 50 MCG/ACT nasal spray Place 2 sprays into both nostrils daily. 16 g 6   No current facility-administered medications for this visit.    Allergies as of 06/22/2014  . (No Known Allergies)    Vitals: BP 144/84 mmHg  Pulse 76  Resp 14  Ht 5' 9.5" (1.765 m)  Wt 206 lb (93.441 kg)  BMI 29.99 kg/m2 Last Weight:  Wt Readings from Last 1 Encounters:  06/22/14 206 lb (93.441 kg)       Last Height:   Ht Readings from Last 1 Encounters:  06/22/14 5' 9.5" (1.765 m)    Physical exam:  General: The patient is awake, alert and appears not in acute distress. The patient is well groomed. Head: Normocephalic, atraumatic. Neck is supple. Mallampati 3  neck circumference: 17.0 . Nasal airflow restricted , TMJ is not  evident . Retrognathia is not noted.  Cardiovascular:  Regular rate and rhythm , without  murmurs or carotid bruit, and without distended neck veins. Respiratory: Lungs are clear to auscultation. Skin:  Without evidence of edema, or rash, Tattoes.  Trunk: BMI is elevated and patient  has normal posture.  Neurologic exam : The patient is awake and alert, oriented to place and time.   Memory subjective described as intact. There is a normal attention span & concentration ability.  Speech is fluent without  dysarthria, dysphonia or aphasia.  Mood and affect are appropriate.  Cranial nerves: Pupils are equal and briskly reactive to light. Funduscopic exam without  evidence of pallor or edema. Extraocular movements  in vertical and horizontal planes intact and without nystagmus. Visual fields by finger  perimetry are intact. Hearing to finger rub intact.  Facial sensation intact to fine touch. Facial motor strength is symmetric and tongue and uvula move midline.  Motor exam:   Normal tone, muscle bulk and symmetric strength in all extremities.  Sensory:  Fine touch, pinprick and vibration were tested in all extremities. Proprioception is tested in the upper extremities only. This was normal.  Coordination: Rapid alternating movements in the fingers/hands is normal. Finger-to-nose maneuver  normal without evidence of ataxia, dysmetria or tremor.  Gait and station: Patient walks without assistive device and is able unassisted to climb up to the exam table. Strength within normal limits. Stance is stable and normal. Tandem gait is unfragmented. Romberg testing is negative.  Deep tendon reflexes: in the  upper and lower extremities are symmetric and intact. Babinski maneuver response is downgoing.   Assessment:  After physical and neurologic examination, review of laboratory studies, imaging, neurophysiology testing and pre-existing records, assessment is   Very likely, this patient has a degree of OSA, but could be a hypo-ventilator as well. He uses habitually Xanax and drinks some alcohol every night , is a loud snorer, and has no restorative .  1)  SPLIT study  AHI 15 and 3% score, [employer]. 2)  This patient cannot have a HST, we need to measure CO2 , and 02 reliably. This patient has morning headaches and has dry eyes and dry mouth,  red eyes, possible autoimmune component     The patient was advised of the nature of the diagnosed sleep disorder , the treatment options and risks for general a health and wellness arising from not treating the condition. Visit duration was 45  minutes.    Plan:  Treatment plan and additional workup :  Get SPLIT, take melatonin, reduce your alcohol intake to one drink with dinner. Reserve xanax as needed.       Asencion Partridge Kyshon Tolliver  MD  06/22/2014

## 2014-06-22 NOTE — Patient Instructions (Signed)
Insomnia Insomnia is frequent trouble falling and/or staying asleep. Insomnia can be a long term problem or a short term problem. Both are common. Insomnia can be a short term problem when the wakefulness is related to a certain stress or worry. Long term insomnia is often related to ongoing stress during waking hours and/or poor sleeping habits. Overtime, sleep deprivation itself can make the problem worse. Every little thing feels more severe because you are overtired and your ability to cope is decreased. CAUSES   Stress, anxiety, and depression.  Poor sleeping habits.  Distractions such as TV in the bedroom.  Naps close to bedtime.  Engaging in emotionally charged conversations before bed.  Technical reading before sleep.  Alcohol and other sedatives. They may make the problem worse. They can hurt normal sleep patterns and normal dream activity.  Stimulants such as caffeine for several hours prior to bedtime.  Pain syndromes and shortness of breath can cause insomnia.  Exercise late at night.  Changing time zones may cause sleeping problems (jet lag). It is sometimes helpful to have someone observe your sleeping patterns. They should look for periods of not breathing during the night (sleep apnea). They should also look to see how long those periods last. If you live alone or observers are uncertain, you can also be observed at a sleep clinic where your sleep patterns will be professionally monitored. Sleep apnea requires a checkup and treatment. Give your caregivers your medical history. Give your caregivers observations your family has made about your sleep.  SYMPTOMS   Not feeling rested in the morning.  Anxiety and restlessness at bedtime.  Difficulty falling and staying asleep. TREATMENT   Your caregiver may prescribe treatment for an underlying medical disorders. Your caregiver can give advice or help if you are using alcohol or other drugs for self-medication. Treatment  of underlying problems will usually eliminate insomnia problems.  Medications can be prescribed for short time use. They are generally not recommended for lengthy use.  Over-the-counter sleep medicines are not recommended for lengthy use. They can be habit forming.  You can promote easier sleeping by making lifestyle changes such as:  Using relaxation techniques that help with breathing and reduce muscle tension.  Exercising earlier in the day.  Changing your diet and the time of your last meal. No night time snacks.  Establish a regular time to go to bed.  Counseling can help with stressful problems and worry.  Soothing music and white noise may be helpful if there are background noises you cannot remove.  Stop tedious detailed work at least one hour before bedtime. HOME CARE INSTRUCTIONS   Keep a diary. Inform your caregiver about your progress. This includes any medication side effects. See your caregiver regularly. Take note of:  Times when you are asleep.  Times when you are awake during the night.  The quality of your sleep.  How you feel the next day. This information will help your caregiver care for you.  Get out of bed if you are still awake after 15 minutes. Read or do some quiet activity. Keep the lights down. Wait until you feel sleepy and go back to bed.  Keep regular sleeping and waking hours. Avoid naps.  Exercise regularly.  Avoid distractions at bedtime. Distractions include watching television or engaging in any intense or detailed activity like attempting to balance the household checkbook.  Develop a bedtime ritual. Keep a familiar routine of bathing, brushing your teeth, climbing into bed at the same   time each night, listening to soothing music. Routines increase the success of falling to sleep faster.  Use relaxation techniques. This can be using breathing and muscle tension release routines. It can also include visualizing peaceful scenes. You can  also help control troubling or intruding thoughts by keeping your mind occupied with boring or repetitive thoughts like the old concept of counting sheep. You can make it more creative like imagining planting one beautiful flower after another in your backyard garden.  During your day, work to eliminate stress. When this is not possible use some of the previous suggestions to help reduce the anxiety that accompanies stressful situations. MAKE SURE YOU:   Understand these instructions.  Will watch your condition.  Will get help right away if you are not doing well or get worse. Document Released: 07/18/2000 Document Revised: 10/13/2011 Document Reviewed: 08/18/2007 Exit[employer] Patient Information 2015 [employer]. This information is not intended to replace advice given to you by your health care provider. Make sure you discuss any questions you have with your health care provider.  

## 2014-07-04 ENCOUNTER — Ambulatory Visit (INDEPENDENT_AMBULATORY_CARE_PROVIDER_SITE_OTHER): Payer: PRIVATE HEALTH INSURANCE | Admitting: Neurology

## 2014-07-04 VITALS — BP 149/81

## 2014-07-04 DIAGNOSIS — R51 Headache: Secondary | ICD-10-CM

## 2014-07-04 DIAGNOSIS — R519 Headache, unspecified: Secondary | ICD-10-CM

## 2014-07-04 DIAGNOSIS — R0683 Snoring: Secondary | ICD-10-CM

## 2014-07-04 DIAGNOSIS — G478 Other sleep disorders: Secondary | ICD-10-CM

## 2014-07-04 NOTE — Sleep Study (Signed)
Please see the scanned sleep study interpretation located in the Procedure tab within the Chart Review section. 

## 2014-07-07 ENCOUNTER — Ambulatory Visit (INDEPENDENT_AMBULATORY_CARE_PROVIDER_SITE_OTHER): Payer: PRIVATE HEALTH INSURANCE | Admitting: Family Medicine

## 2014-07-07 VITALS — BP 140/82 | HR 84 | Temp 97.8°F | Resp 16 | Ht 69.5 in | Wt 204.0 lb

## 2014-07-07 DIAGNOSIS — R55 Syncope and collapse: Secondary | ICD-10-CM

## 2014-07-07 DIAGNOSIS — S0180XA Unspecified open wound of other part of head, initial encounter: Secondary | ICD-10-CM

## 2014-07-07 NOTE — Patient Instructions (Signed)
WOUND CARE ?Please return in 6 days to have your stitches/staples removed or sooner if you have concerns. ? Keep area clean and dry for 24 hours. Do not remove bandage, if applied. ? After 24 hours, remove bandage and wash wound gently with mild soap and warm water. Reapply a new bandage after cleaning wound, if directed. ? Continue daily cleansing with soap and water until stitches/staples are removed. ? Do not apply any ointments or creams to the wound while stitches/staples are in place, as this may cause delayed healing. ? Notify the office if you experience any of the following signs of infection: Swelling, redness, pus drainage, streaking, fever >101.0 F ? Notify the office if you experience excessive bleeding that does not stop after 15-20 minutes of constant, firm pressure. ? ?

## 2014-07-07 NOTE — Progress Notes (Signed)
Patient ID: John Preston, male   DOB: 03-18-1956, 58 y.o.   MRN: [id number]  This chart was scribed for Robyn Haber, [place], ED Scribe. The patient was seen in room 7. Patient's care was started at 10:19 AM.  Patient ID: John Preston MRN: [id number], DOB: 1956/06/25, 58 y.o. Date of Encounter: 07/07/2014, 10:19 AM  Primary Physician: [name], MD  Chief Complaint  Patient presents with  . Facial Laceration    Forehead- fell when getting out of bed last night and struck head on TV stand   . Loss of Consciousness    LOC shortly after hitting head last night. Pt states he feels fine today.    HPI: 58 y.o. year old male with history below presents with a facial laceration sustained last night. Pt reports that he slipped on a blanket last night while getting out of bed and struck his head on a TV stand. He states that the flat screen TV landed on the back of his head after his head struck the TV stand. Pt reports associated LOC for a short period of time last night following fall. He suspects that LOC was due to seeing the amount of blood that he lost. Pt applied isopropyl and alcohol to the laceration last night.  BP Pt denies h/o HTN. BP during examination: 110/70 sitting, 120/70 standing.  Past Medical History  Diagnosis Date  . Pain in joint, shoulder region   . Other specified viral warts   . Pure hypercholesterolemia   . Genital herpes, unspecified   . Other specified inflammatory disease of prostate     outlet obstruction with BPH.Urology consult 2006 [facility]  . Unspecified hearing loss   . Insomnia, unspecified   . Encounter for therapeutic drug monitoring   . Lumbago   . Internal hemorrhoids without mention of complication   . Anxiety state, unspecified   . Unspecified vitamin D deficiency   . Basal cell carcinoma of skin, site unspecified     L nasal; [facility].  . Genital herpes   . Depression   . Arthritis   . Internal hemorrhoids    colonoscopy 08/04/2008.  [facility].  . 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 Sleep related headaches 06/22/2014  . Sleep apnea   . Sleep apnea      Home Meds: Prior to Admission medications   Medication Sig Start Date End Date Taking? Authorizing Provider  ALPRAZolam Duanne Moron) 1 MG tablet Take 1 tablet (1 mg total) by mouth at bedtime as needed. 03/06/14  Yes Wardell Honour, MD  escitalopram (LEXAPRO) 20 MG tablet Take 1 tablet (20 mg total) by mouth daily. 03/06/14  Yes Wardell Honour, MD  Multiple Vitamin (MULTIVITAMIN) tablet Take 1 tablet by mouth daily.   Yes Historical Provider, MD  simvastatin (ZOCOR) 80 MG tablet Take 40 mg by mouth at bedtime. 09/05/13  Yes Wardell Honour, MD  acyclovir (ZOVIRAX) 800 MG tablet Take 1 tablet (800 mg total) by mouth 3 (three) times daily. Patient not taking: Reported on 07/07/2014 06/28/12   Wardell Honour, MD  fluticasone [facility]) 50 MCG/ACT nasal spray Place 2 sprays into both nostrils daily. Patient not taking: Reported on 07/07/2014 04/27/14   Wardell Honour, MD  indomethacin (INDOCIN) 25 MG capsule Take 1 capsule (25 mg total) by mouth 2 (two) times daily with a meal. Patient not taking: Reported on 07/07/2014 09/05/13   Wardell Honour, MD  SUMAtriptan (IMITREX) 50 MG tablet Take 1 tablet (50 mg total) by  mouth 4 (four) times daily as needed for migraine or headache. May repeat in 2 hours after 1st dose  if headache persists or recurs. Not to exceed 300 mg in 24 hours Patient not taking: Reported on 07/07/2014 03/10/14   Thao P Le, DO    Allergies: No Known Allergies  History   Social History  . Marital Status: Married    Spouse Name: N/A    Number of Children: 2  . Years of Education: N/A   Occupational History  . Sales    Social History Main Topics  . Smoking status: Never Smoker   . Smokeless tobacco: Never Used  . Alcohol Use: 9.0 oz/week    15 Glasses of wine per week     Comment: moderate 1-2 glasses of wine per day  . Drug Use: No     Comment:  prevoiusly used marijuana  . Sexual Activity: Yes   Other Topics Concern  . Not on file   Social History Narrative   Marital status:  Married x 31 years, happily.      Children: two children; no grandchildren.      Lives: with wife, adult daughter.      Tobacco:  None       Alcohol:   2 glasses of wine per night.   Always uses seat belts. Smoke alarm in the home. Carbon monoxide detector in the home. Guns in the home not stored in locked cabinet. Exercise: Light, job very physically demanding walks a lot. Caffeine use: 2 servings Coffee per day.    Review of Systems: Constitutional: negative for chills, fever, night sweats, weight changes, or fatigue  HEENT: negative for vision changes, hearing loss, congestion, rhinorrhea, ST, epistaxis, or sinus pressure Cardiovascular: negative for chest pain or palpitations Respiratory: negative for hemoptysis, wheezing, shortness of breath, or cough Abdominal: negative for abdominal pain, nausea, vomiting, diarrhea, or constipation Dermatological: negative for rash, +wound Neurologic: negative for headache, dizziness, +syncope All other systems reviewed and are otherwise negative with the exception to those above and in the HPI.  Physical Exam: Triage Vitals: Blood pressure 140/82, pulse 84, temperature 97.8 F (36.6 C), temperature source Oral, resp. rate 16, height 5' 9.5" (1.765 m), weight 204 lb (92.534 kg), SpO2 97 %., Body mass index is 29.7 kg/(m^2). BP during examination: 110/70 sitting; 120/70 standing General: Well developed, well nourished, in no acute distress. Head: Eyes without discharge, sclera non-icteric, nares are without discharge. Bilateral auditory canals clear, TM's are without perforation, pearly grey and translucent with reflective cone of light bilaterally. Oral cavity moist, posterior pharynx without exudate, erythema, peritonsillar abscess, or post nasal drip. 2.5 cm laceration that begins in midline, 1 cm above nasal  bridge and extends diagonally over the L orbit to the scalp line. Neck: Supple. No thyromegaly. Full ROM. No lymphadenopathy. Lungs: Clear bilaterally to auscultation without wheezes, rales, or rhonchi. Breathing is unlabored. Heart: RRR with S1 S2. No murmurs, rubs, or gallops appreciated. Abdomen: Soft, non-tender, non-distended with normoactive bowel sounds. No hepatomegaly. No rebound/guarding. No obvious abdominal masses. Msk:  Strength and tone normal for age. Extremities/Skin: Warm and dry. No clubbing or cyanosis. No edema. No rashes or suspicious lesions.  Neuro: Alert and oriented X 3. Moves all extremities spontaneously. Gait is normal. CNII-XII grossly in tact. Psych:  Responds to questions appropriately with a normal affect.    ASSESSMENT AND PLAN:  58 y.o. year old male with syncopal episode. He apparently got tangled in the blanket of his bed,  fell, and had a syncopal episode in the kitchen which was brief and not associated with any lasting neurological symptomatology. He does have a deep laceration about 3 cm in length on his forehead. This was repaired nicely by the PA. He will be coming back in 5 days to have the sutures removed.  I personally performed the services described in this documentation, which was scribed in my presence. The recorded information has been reviewed and is accurate.  Signed, Robyn Haber, MD 07/07/2014 10:19 AM

## 2014-07-13 ENCOUNTER — Telehealth: Payer: Self-pay | Admitting: Neurology

## 2014-07-13 ENCOUNTER — Encounter: Payer: Self-pay | Admitting: Neurology

## 2014-07-13 ENCOUNTER — Other Ambulatory Visit: Payer: Self-pay | Admitting: Neurology

## 2014-07-13 DIAGNOSIS — R0683 Snoring: Secondary | ICD-10-CM | POA: Insufficient documentation

## 2014-07-13 DIAGNOSIS — G4733 Obstructive sleep apnea (adult) (pediatric): Secondary | ICD-10-CM

## 2014-07-18 NOTE — Telephone Encounter (Signed)
The patient was called and provided the results from his recent sleep study.  His aware of the diagnosis of obstructive sleep apnea and the recommendation to begin CPAP therapy.  He understands that an order has been sent to [facility] for processing.  A copy of the patient's sleep study report was sent to the referring provider.  The patient has requested to receive his sleep study results via mail.

## 2014-08-21 ENCOUNTER — Encounter: Payer: Self-pay | Admitting: Neurology

## 2014-09-07 ENCOUNTER — Other Ambulatory Visit: Payer: Self-pay | Admitting: Family Medicine

## 2014-09-20 ENCOUNTER — Ambulatory Visit (INDEPENDENT_AMBULATORY_CARE_PROVIDER_SITE_OTHER): Payer: PRIVATE HEALTH INSURANCE | Admitting: Family Medicine

## 2014-09-20 ENCOUNTER — Encounter: Payer: Self-pay | Admitting: Family Medicine

## 2014-09-20 VITALS — BP 130/86 | HR 73 | Temp 98.2°F | Resp 16 | Ht 69.0 in | Wt 201.8 lb

## 2014-09-20 DIAGNOSIS — F411 Generalized anxiety disorder: Secondary | ICD-10-CM

## 2014-09-20 DIAGNOSIS — Z0000 Encounter for general adult medical examination without abnormal findings: Secondary | ICD-10-CM

## 2014-09-20 DIAGNOSIS — E7800 Pure hypercholesterolemia, unspecified: Secondary | ICD-10-CM

## 2014-09-20 DIAGNOSIS — R7302 Impaired glucose tolerance (oral): Secondary | ICD-10-CM

## 2014-09-20 DIAGNOSIS — G43009 Migraine without aura, not intractable, without status migrainosus: Secondary | ICD-10-CM

## 2014-09-20 DIAGNOSIS — Z9989 Dependence on other enabling machines and devices: Secondary | ICD-10-CM

## 2014-09-20 DIAGNOSIS — Z125 Encounter for screening for malignant neoplasm of prostate: Secondary | ICD-10-CM

## 2014-09-20 DIAGNOSIS — G4733 Obstructive sleep apnea (adult) (pediatric): Secondary | ICD-10-CM

## 2014-09-20 LAB — POCT URINALYSIS DIPSTICK
Bilirubin, UA: NEGATIVE
Glucose, UA: NEGATIVE
KETONES UA: NEGATIVE
LEUKOCYTES UA: NEGATIVE
NITRITE UA: NEGATIVE
PH UA: 5.5
PROTEIN UA: NEGATIVE
Spec Grav, UA: 1.025
Urobilinogen, UA: 0.2

## 2014-09-20 LAB — CBC WITH DIFFERENTIAL/PLATELET
BASOS PCT: 1 % (ref 0–1)
Basophils Absolute: 0 10*3/uL (ref 0.0–0.1)
EOS PCT: 4 % (ref 0–5)
Eosinophils Absolute: 0.2 10*3/uL (ref 0.0–0.7)
HCT: 45.9 % (ref 39.0–52.0)
Hemoglobin: 16 g/dL (ref 13.0–17.0)
LYMPHS ABS: 1.2 10*3/uL (ref 0.7–4.0)
Lymphocytes Relative: 29 % (ref 12–46)
MCH: 32.5 pg (ref 26.0–34.0)
MCHC: 34.9 g/dL (ref 30.0–36.0)
MCV: 93.1 fL (ref 78.0–100.0)
MPV: 9.5 fL (ref 8.6–12.4)
Monocytes Absolute: 0.4 10*3/uL (ref 0.1–1.0)
Monocytes Relative: 9 % (ref 3–12)
Neutro Abs: 2.5 10*3/uL (ref 1.7–7.7)
Neutrophils Relative %: 57 % (ref 43–77)
PLATELETS: 211 10*3/uL (ref 150–400)
RBC: 4.93 MIL/uL (ref 4.22–5.81)
RDW: 12.9 % (ref 11.5–15.5)
WBC: 4.3 10*3/uL (ref 4.0–10.5)

## 2014-09-20 MED ORDER — ALPRAZOLAM 1 MG PO TABS: 1.0000 mg | ORAL_TABLET | Freq: Every evening | ORAL | Status: DC | PRN

## 2014-09-20 MED ORDER — ESCITALOPRAM OXALATE 20 MG PO TABS: ORAL_TABLET | ORAL | Status: DC

## 2014-09-20 MED ORDER — SIMVASTATIN 80 MG PO TABS: 40.0000 mg | ORAL_TABLET | Freq: Every day | ORAL | Status: DC

## 2014-09-20 NOTE — Patient Instructions (Signed)

## 2014-09-20 NOTE — Progress Notes (Signed)
Subjective:    Patient ID: John Preston, male    DOB: [birth year], 59 y.o.   MRN: [id number]  09/20/2014  Annual Exam   HPI This 59 y.o. male presents for Complete Physical Examination.  Last physical:  09-05-2013 Colonoscopy: 2010; internal hemorrhoids; no polyps; repeat ten years. [place]. TDAP:  2012 Influenza: 04/27/2014 Eye exam:  No; +readers Dental exam:  Every six months.  OSA: when started sleep study at 9:00pm; ended study at 12:00am; waking up 22 times per hour. Placed CPAP machine and second half 0.7 times per hour.  One headache and was not severe.  Before, awoke with headaches every morning.  This headache was not upon awakening.  Energy level is much better.  Needs FMLA paperwork to company; missed a lot of work due to morning headaches.  Missed one day due to acute illness.  Started CPAP in November 2015.  Follow-up next with neurology for three month follow-up.  Tolerates CPAP very well.  Falls asleep quickly.  Still takes Xanax due to habit.    Anxiety: unchanged/stable; decreased nighttime awakening; more energetic per day with CPAP.  Biggest stressor is adult daughter who cannot function independently.  Patient reports good compliance with medication, good tolerance to medication, and good symptom control.   Taking Lexapro daily; taking Xanax 1mg  qhs.    Insomnia:  Still taking 1 Xanax qhs.  Falling asleep easily with CPAP.   Review of Systems  Constitutional: Negative for fever, chills, diaphoresis, activity change, appetite change, fatigue and unexpected weight change.  HENT: Positive for hearing loss. Negative for congestion, dental problem, drooling, ear discharge, ear pain, facial swelling, mouth sores, nosebleeds, postnasal drip, rhinorrhea, sinus pressure, sneezing, sore throat, tinnitus, trouble swallowing and voice change.   Eyes: Positive for redness and itching. Negative for photophobia, pain, discharge and visual disturbance.  Respiratory: Positive for  apnea. Negative for cough, choking, chest tightness, shortness of breath, wheezing and stridor.   Cardiovascular: Negative for chest pain, palpitations and leg swelling.  Gastrointestinal: Negative for nausea, vomiting, abdominal pain, diarrhea, constipation and blood in stool.  Endocrine: Negative for cold intolerance, heat intolerance, polydipsia, polyphagia and polyuria.  Genitourinary: Negative for dysuria, urgency, frequency, hematuria, flank pain, decreased urine volume, discharge, penile swelling, scrotal swelling, enuresis, difficulty urinating, genital sores, penile pain and testicular pain.  Musculoskeletal: Negative for myalgias, back pain, joint swelling, arthralgias, gait problem, neck pain and neck stiffness.  Skin: Positive for rash. Negative for color change, pallor and wound.  Allergic/Immunologic: Negative for environmental allergies, food allergies and immunocompromised state.  Neurological: Negative for dizziness, tremors, seizures, syncope, facial asymmetry, speech difficulty, weakness, light-headedness, numbness and headaches.  Hematological: Negative for adenopathy. Does not bruise/bleed easily.  Psychiatric/Behavioral: Negative for suicidal ideas, hallucinations, behavioral problems, confusion, sleep disturbance, self-injury, dysphoric mood, decreased concentration and agitation. The patient is not nervous/anxious and is not hyperactive.     Past Medical History  Diagnosis Date  . Pain in joint, shoulder region   . Other specified viral warts   . Pure hypercholesterolemia   . Genital herpes, unspecified   . Other specified inflammatory disease of prostate     outlet obstruction with BPH.Urology consult 2006 [facility]  . Unspecified hearing loss   . Insomnia, unspecified   . Encounter for therapeutic drug monitoring   . Lumbago   . Internal hemorrhoids without mention of complication   . Anxiety state, unspecified   . Unspecified vitamin D deficiency    . Basal cell carcinoma of skin,  site unspecified     L nasal; [facility].  . Genital herpes   . Depression   . Arthritis   . Internal hemorrhoids     colonoscopy 08/04/2008.  [facility].  . 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 Sleep related headaches 06/22/2014  . Sleep apnea   . Sleep apnea    Past Surgical History  Procedure Laterality Date  . Knee surgery  1991  . Prostate surgery  2010    for BPH  . L. facial basal cell carcinoma resection  11/2010  . Colonoscopy  08/04/2008    internal hemorrhoids; repeat in 10 years.  [facility].  . Vasectomy     No Known Allergies Current Outpatient Prescriptions  Medication Sig Dispense Refill  . acyclovir (ZOVIRAX) 800 MG tablet Take 1 tablet (800 mg total) by mouth 3 (three) times daily. 30 tablet 4  . ALPRAZolam (XANAX) 1 MG tablet Take 1 tablet (1 mg total) by mouth at bedtime as needed. 30 tablet 5  . escitalopram (LEXAPRO) 20 MG tablet TAKE 1 TABLET BY MOUTH DAILY 30 tablet 5  . indomethacin (INDOCIN) 25 MG capsule Take 1 capsule (25 mg total) by mouth 2 (two) times daily with a meal. 60 capsule 3  . Multiple Vitamin (MULTIVITAMIN) tablet Take 1 tablet by mouth daily.    . simvastatin (ZOCOR) 80 MG tablet Take 0.5 tablets (40 mg total) by mouth at bedtime. 30 tablet 5  . SUMAtriptan (IMITREX) 50 MG tablet Take 1 tablet (50 mg total) by mouth 4 (four) times daily as needed for migraine or headache. May repeat in 2 hours after 1st dose  if headache persists or recurs. Not to exceed 300 mg in 24 hours 10 tablet 5  . fluticasone (FLONASE) 50 MCG/ACT nasal spray Place 2 sprays into both nostrils daily. (Patient not taking: Reported on 07/07/2014) 16 g 6   No current facility-administered medications for this visit.   History   Social History  . Marital Status: Married    Spouse Name: N/A  . Number of Children: 2  . Years of Education: N/A   Occupational History  . Sales    Social History Main Topics  . Smoking status: Never Smoker   . Smokeless  tobacco: Never Used  . Alcohol Use: 9.0 oz/week    15 Glasses of wine per week     Comment: moderate 1-2 glasses of wine per day  . Drug Use: No     Comment: prevoiusly used marijuana  . Sexual Activity: Yes   Other Topics Concern  . Not on file   Social History Narrative   Marital status:  Married x 31 years, happily.      Children: two children; no grandchildren.      Lives: with wife, adult daughter.      Tobacco:  None       Alcohol:   2 glasses of wine per night.   Always uses seat belts. Smoke alarm in the home. Carbon monoxide detector in the home. Guns in the home not stored in locked cabinet. Exercise: Light, job very physically demanding walks a lot. Caffeine use: 2 servings Coffee per day.   Family History  Problem Relation Age of Onset  . COPD Mother   . Diabetes Mother   . Heart disease Mother 82    cardiac stenting/CAD; no AMI  . Cancer Maternal Grandmother   . Diabetes Maternal Grandmother   . Lung disease      Oxygen dependent  . Tics Father   .  Dementia Father   . Mental illness Father        Objective:    BP 130/86 mmHg  Pulse 73  Temp(Src) 98.2 F (36.8 C) (Oral)  Resp 16  Ht 5\' 9"  (1.753 m)  Wt 201 lb 12.8 oz (91.536 kg)  BMI 29.79 kg/m2  SpO2 96% Physical Exam  Constitutional: He is oriented to person, place, and time. He appears well-developed and well-nourished. No distress.  HENT:  Head: Normocephalic and atraumatic.  Right Ear: External ear normal.  Left Ear: External ear normal.  Nose: Nose normal.  Mouth/Throat: Oropharynx is clear and moist.  Eyes: Conjunctivae and EOM are normal. Pupils are equal, round, and reactive to light.  Neck: Normal range of motion. Neck supple. Carotid bruit is not present. No thyromegaly present.  Cardiovascular: Normal rate, regular rhythm, normal heart sounds and intact distal pulses.  Exam reveals no gallop and no friction rub.   No murmur heard. Pulmonary/Chest: Effort normal and breath sounds  normal. He has no wheezes. He has no rales.  Abdominal: Soft. Bowel sounds are normal. He exhibits no distension and no mass. There is no tenderness. There is no rebound and no guarding. Hernia confirmed negative in the right inguinal area and confirmed negative in the left inguinal area.  Genitourinary: Rectum normal, prostate normal, testes normal and penis normal. Right testis shows no mass, no swelling and no tenderness. Left testis shows no mass, no swelling and no tenderness. Circumcised.  Musculoskeletal:       Right shoulder: Normal.       Left shoulder: Normal.       Cervical back: Normal.  Lymphadenopathy:    He has no cervical adenopathy.       Right: No inguinal adenopathy present.       Left: No inguinal adenopathy present.  Neurological: He is alert and oriented to person, place, and time. He has normal reflexes. No cranial nerve deficit. He exhibits normal muscle tone. Coordination normal.  Skin: Skin is warm and dry. No rash noted. He is not diaphoretic.  Psychiatric: He has a normal mood and affect. His behavior is normal. Judgment and thought content normal.        Assessment & Plan:   1. Routine physical examination   2. Generalized anxiety disorder   3. Pure hypercholesterolemia   4. Glucose intolerance (impaired glucose tolerance)   5. OSA on CPAP   6. Migraine without aura and without status migrainosus, not intractable   7. Screening for prostate cancer     1. Complete Physical Examination: anticipatory guidance --- weight loss, exercise, decrease in alcohol consumption.  Colonoscopy UTD.  Immunizations UTD.  Undergoing treatment for anxiety/depression. 2. Generalized anxiety disorder: stable; continue Lexapro 20mg  daily; refill provided; follow-up in six months. 3.  Glucose intolerance: stable; obtain labs; dietary modification recommended. 4.  Hypercholesterolemia: controlled; obtain labs; refill provided. 5.  OSA: New diagnosis; feeling much better on CPAP;  managed per sleep medicine; follow-up next week. 6.  Migraines: improved with treatment of OSA.   7.  Screening prostate cancer: DRE completed; obtain PSA. 8. Insomnia: improved; decrease Xanax to 1mg  1/2 qhs.      Meds ordered this encounter  Medications  . ALPRAZolam (XANAX) 1 MG tablet    Sig: Take 1 tablet (1 mg total) by mouth at bedtime as needed.    Dispense:  30 tablet    Refill:  5  . escitalopram (LEXAPRO) 20 MG tablet    Sig: TAKE 1  TABLET BY MOUTH DAILY    Dispense:  30 tablet    Refill:  5  . simvastatin (ZOCOR) 80 MG tablet    Sig: Take 0.5 tablets (40 mg total) by mouth at bedtime.    Dispense:  30 tablet    Refill:  5    Return in about 6 months (around 03/21/2015) for recheck high cholesterol, anxiety.     Kristi Elayne Guerin, M.D. [facility] [address] [place], [place]  [address] [phone] phone [phone] fax

## 2014-09-21 ENCOUNTER — Encounter: Payer: Self-pay | Admitting: Family Medicine

## 2014-09-21 LAB — LIPID PANEL
Cholesterol: 175 mg/dL (ref 0–200)
HDL: 50 mg/dL (ref >39)
LDL CALC: 90 mg/dL (ref 0–99)
Total CHOL/HDL Ratio: 3.5 Ratio
Triglycerides: 175 mg/dL — ABNORMAL HIGH (ref <150)
VLDL: 35 mg/dL (ref 0–40)

## 2014-09-21 LAB — COMPREHENSIVE METABOLIC PANEL
ALBUMIN: 4.3 g/dL (ref 3.5–5.2)
ALT: 37 U/L (ref 0–53)
AST: 24 U/L (ref 0–37)
Alkaline Phosphatase: 42 U/L (ref 39–117)
BUN: 18 mg/dL (ref 6–23)
CHLORIDE: 106 meq/L (ref 96–112)
CO2: 25 meq/L (ref 19–32)
CREATININE: 0.91 mg/dL (ref 0.50–1.35)
Calcium: 8.9 mg/dL (ref 8.4–10.5)
GLUCOSE: 91 mg/dL (ref 70–99)
Potassium: 4.3 mEq/L (ref 3.5–5.3)
Sodium: 142 mEq/L (ref 135–145)
Total Bilirubin: 0.8 mg/dL (ref 0.2–1.2)
Total Protein: 6.7 g/dL (ref 6.0–8.3)

## 2014-09-21 LAB — TSH: TSH: 1.722 u[IU]/mL (ref 0.350–4.500)

## 2014-09-21 LAB — HEMOGLOBIN A1C
HEMOGLOBIN A1C: 5.7 % — AB (ref <5.7)
Mean Plasma Glucose: 117 mg/dL — ABNORMAL HIGH (ref <117)

## 2014-09-21 LAB — PSA: PSA: 0.49 ng/mL (ref <=4.00)

## 2014-09-27 ENCOUNTER — Encounter: Payer: Self-pay | Admitting: Neurology

## 2014-09-27 ENCOUNTER — Ambulatory Visit (INDEPENDENT_AMBULATORY_CARE_PROVIDER_SITE_OTHER): Payer: PRIVATE HEALTH INSURANCE | Admitting: Neurology

## 2014-09-27 VITALS — BP 126/79 | HR 71 | Resp 18 | Ht 69.0 in | Wt 208.0 lb

## 2014-09-27 DIAGNOSIS — G4733 Obstructive sleep apnea (adult) (pediatric): Secondary | ICD-10-CM

## 2014-09-27 DIAGNOSIS — Z9989 Dependence on other enabling machines and devices: Secondary | ICD-10-CM | POA: Insufficient documentation

## 2014-09-27 HISTORY — DX: Dependence on other enabling machines and devices: Z99.89

## 2014-09-27 HISTORY — DX: Obstructive sleep apnea (adult) (pediatric): G47.33

## 2014-09-27 NOTE — Progress Notes (Signed)
SLEEP MEDICINE CLINIC   Provider:  Larey Seat, M D  Referring Provider: Wardell Honour, MD Primary Care Physician:  [name], MD  Chief Complaint  Patient presents with  . OSA CPAP    Rm- 11 follow up    HPI:  John Preston is a 59 y.o. caucasian, married, right handed  male , seen here as a referral from Dr. Tamala Julian for a sleep evaluation.   Dear Dr. Tamala Julian,   Thank you for entrusting your patient , John Preston , to my care.  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 He has been unable to enjoy restorative sleep from many month, likely a couple of years. He noted a progression i fatigue and excessive sleepiness over th last 18 month.   His wife reports him to snore, but not to have apnea. He has frequent morning headaches, on occasion he has been woken by headaches. These HA last almost all day, resolve at dinner time.  He was treated with Imitrex after his sister reported good success with her migrainous headaches. It did not help him, and neither did Indocin.  He has throbbing headaches one side, left temple or forehead or behind the left eye, he has mild phono- and photophobia and has tunnel vision with Headaches. 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  He feels every morning as if he hadn't slept enough, is unrefreshed and unrestored. He struggles through his day at work and l takes a power nap at 11 .30 ( lunch break )  in his car for 30-40  minutes, until waking from sleep choking. He does not experience these when sleeping in bed, in supine.  He wakes up with a dry mouth and HA. The nap is not more refreshing that the nocturnal sleep.  His bed time is around around 9 PM oldest the patient and his wife enjoy watching TV in the bedroom for about an hour. He will usually be asleep between 10 and 11 PM, his wife reports him to snore but has not witnessed apneas per se. The  patient had undergone prostate surgery for the treatment of medication nonresponsive hypertrophy and he has 1-2 nocturia is at night. Usually, he goes back to sleep.  He rises at 6 AM and spontaneoulsy wakes in time, at 5.30 AM. He has 1 mug of coffee in AM for breakfast, and drinks in on his way to work, takes his medication in th his car , too. He often has a post nasal drip and takes allergy medication. He has been taking Lexapro and xanax for mood and sleep improvement.   He drinks alcohol daily, 2 glasses beer or wine  and this feels good , relaxing.   John Preston is here today for his first consultation after sleep study.09-27-14,   His split-night polysomnography was performed on 07-04-14, his technologist was Linus Mako. He was diagnosed with an AHI of 22.5 per hour of sleep and an RDI of 24.9 he had no periodic limb movements. See was 70% his lowest oxygen level was 87 during non-REM sleep in supine position. He had only 18 minutes of sleep at or below 90% oxygen saturation. CPAP was initiated and raise the oxygen nadir to 91%, initially he had some trouble sleeping with the CPAP the very first night. Overall he seemed to have done well on only 6 cm water pressure  under which she slept for 92.1 minutes and had 6.6 and minutes of REM sleep. He was then placed on an hour to titrate her and 8 cm water revealed was revealed as the best pressure for him. The download was quoted above. He has currently in a residual AHI of 2.297% compliance for over 4 hours of daily use in a 70 day download was 8 hours and 37 minutes of nightly use. The patient states that his wife has reported him to sleep less restless and that he feels better rested and refreshed in the morning he has also not lost any days at work. Mr. Loiseau states that he still feels often a little groggy in the morning but as his day goes on he has no longer the heavy fatigue, which dragged him down. He now sleeps and dreams, he sleeps soundly. He  dreams a lot, often continues a dream after a short arousal period.  The dreams are vivid, not nightmarish. NO Sleep paralysis. No cataplexy. No sleep attacks.    Review of Systems: Out of a complete 14 system review, the patient complains of only the following symptoms, and all other reviewed systems are negative. He endorsed fatigue, eye redness and itching, hearing loss, headaches . Nov 2015 Epworth score 9 , Fatigue severity score 42   , depression score was not obtained. The dreams are vivid, not nightmarish. NO Sleep paralysis. No cataplexy. No sleep attacks.  09-27-14 : Epworth now on CPAP 8    FSS at  28  . Depression N/A .    History   Social History  . Marital Status: Married    Spouse Name: N/A  . Number of Children: 2  . Years of Education: N/A   Occupational History  . Sales    Social History Main Topics  . Smoking status: Never Smoker   . Smokeless tobacco: Never Used  . Alcohol Use: 9.0 oz/week    15 Glasses of wine per week     Comment: moderate 1-2 glasses of wine per day; more on weekends.  . Drug Use: No     Comment: prevoiusly used marijuana  . Sexual Activity: Yes   Other Topics Concern  . Not on file   Social History Narrative   Marital status:  Married x 32 years, happily.      Children: two adult children (Chelsea and Theresia Lo); no grandchildren.      Lives: with wife.      Tobacco:  None       Alcohol:   2 glasses of wine per night.  Usually 3 on weekends.  Some beer.      Drugs: none      Exercise: light; job very physically demanding.       Seatbelts:  Always uses seat belts.       Home safety:  Smoke alarm in the home. Carbon monoxide detector in the home.       Guns:  Guns in the home not stored in locked cabinet.       Caffeine use: 2 servings Coffee per day.    Family History  Problem Relation Age of Onset  . COPD Mother   . Diabetes Mother   . Heart disease Mother 33    cardiac stenting/CAD; no AMI  . Cancer Maternal Grandmother   .  Diabetes Maternal Grandmother   . Lung disease      Oxygen dependent  . Tics Father   . Dementia Father   .  Mental illness Father     Past Medical History  Diagnosis Date  . Pain in joint, shoulder region   . Other specified viral warts   . Pure hypercholesterolemia   . Genital herpes, unspecified   . Other specified inflammatory disease of prostate     outlet obstruction with BPH.Urology consult 2006 [facility]  . Unspecified hearing loss   . Insomnia, unspecified   . Encounter for therapeutic drug monitoring   . Lumbago   . Internal hemorrhoids without mention of complication   . Anxiety state, unspecified   . Unspecified vitamin D deficiency   . Basal cell carcinoma of skin, site unspecified     L nasal; [facility].  . Genital herpes   . Depression   . Arthritis   . Internal hemorrhoids     colonoscopy 08/04/2008.  [facility].  . 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 Sleep related headaches 06/22/2014  . Sleep apnea   . Sleep apnea   . OSA on CPAP 09/27/2014    Past Surgical History  Procedure Laterality Date  . Knee surgery  1991  . Prostate surgery  2010    for BPH  . L. facial basal cell carcinoma resection  11/2010  . Colonoscopy  08/04/2008    internal hemorrhoids; repeat in 10 years.  [facility].  . Vasectomy      Current Outpatient Prescriptions  Medication Sig Dispense Refill  . acyclovir (ZOVIRAX) 800 MG tablet Take 1 tablet (800 mg total) by mouth 3 (three) times daily. 30 tablet 4  . ALPRAZolam (XANAX) 1 MG tablet Take 1 tablet (1 mg total) by mouth at bedtime as needed. 30 tablet 5  . escitalopram (LEXAPRO) 20 MG tablet TAKE 1 TABLET BY MOUTH DAILY 30 tablet 5  . indomethacin (INDOCIN) 25 MG capsule Take 1 capsule (25 mg total) by mouth 2 (two) times daily with a meal. 60 capsule 3  . Multiple Vitamin (MULTIVITAMIN) tablet Take 1 tablet by mouth daily.    . simvastatin (ZOCOR) 80 MG tablet Take 0.5 tablets (40 mg total) by mouth at bedtime. 30 tablet 5  .  SUMAtriptan (IMITREX) 50 MG tablet Take 1 tablet (50 mg total) by mouth 4 (four) times daily as needed for migraine or headache. May repeat in 2 hours after 1st dose  if headache persists or recurs. Not to exceed 300 mg in 24 hours 10 tablet 5   No current facility-administered medications for this visit.    Allergies as of 09/27/2014  . (No Known Allergies)    Vitals: BP 126/79 mmHg  Pulse 71  Resp 18  Ht 5\' 9"  (1.753 m)  Wt 208 lb (94.348 kg)  BMI 30.70 kg/m2 Last Weight:  Wt Readings from Last 1 Encounters:  09/27/14 208 lb (94.348 kg)       Last Height:   Ht Readings from Last 1 Encounters:  09/27/14 5\' 9"  (1.753 m)    Physical exam:  General: The patient is awake, alert and appears not in acute distress. The patient is well groomed. Head: Normocephalic, atraumatic. Neck is supple. Mallampati 3  neck circumference: 17.0 . Nasal airflow restricted , TMJ is not  evident . Retrognathia is not noted.  Cardiovascular:  Regular rate and rhythm , without  murmurs or carotid bruit, and without distended neck veins. Respiratory: Lungs are clear to auscultation. Skin:  Without evidence of edema, or rash, Tattoes.  Trunk: BMI is elevated and patient  has normal posture.  Neurologic exam : The patient is  awake and alert, oriented to place and time.   Memory subjective described as intact. There is a normal attention span & concentration ability.  Speech is fluent without  dysarthria, dysphonia or aphasia.  Mood and affect are appropriate.  Cranial nerves: Pupils are equal and briskly reactive to light. Funduscopic exam without  evidence of pallor or edema. Extraocular movements  in vertical and horizontal planes intact and without nystagmus. Visual fields by finger perimetry are intact. Hearing to finger rub intact.  Facial sensation intact to fine touch. Facial motor strength is symmetric and tongue and uvula move midline.  Motor exam:   Normal tone, muscle bulk and symmetric  strength in all extremities.  Sensory:  Fine touch, pinprick and vibration were tested in all extremities. Proprioception is tested in the upper extremities only. This was normal.  Coordination: Rapid alternating movements in the fingers/hands is normal. Finger-to-nose maneuver  normal without evidence of ataxia, dysmetria or tremor.  Gait and station: Patient walks without assistive device and is able unassisted to climb up to the exam table. Strength within normal limits. Stance is stable and normal. Tandem gait is unfragmented. Romberg testing is negative.  Deep tendon reflexes: in the  upper and lower extremities are symmetric and intact. Babinski maneuver response is downgoing.   Assessment:  After physical and neurologic examination, review of laboratory studies, imaging, neurophysiology testing and pre-existing records, assessment is   Very likely, this patient has a degree of OSA, but could be a hypo-ventilator as well. He uses habitually Xanax and drinks some alcohol every night , is a loud snorer, and has no restorative .  1)  Sleep apnea, AHI of 22 and December 2015 the patient is now titrated to 8 cm water pressure was 1 cm EPR and uses a chin strap, residual AHI was 2.2 he is highly compliant 97% compliance rating on 2-20 2-16. He uses a nasal mask the interface is called neurologic Efudex he has full facial hair but the nasal mask seems to covered well and he does not have high air leaks.Marland Kitchen He is due for a new liner.   [facility] [phone].   2)This patient has had morning headaches before CPAP use , and has dry eyes and dry mouth,  red eyes, possible autoimmune component  . He has not had migraine since starting CPAP, has not needed Flonase .  The patient was advised of the nature of the diagnosed sleep disorder , the treatment options ( he is not a dental candidate)  and risks for general a health and wellness arising from not treating the condition. Visit duration was 28minutes.     Plan:  Treatment plan and additional workup :   Continue CPAP at 8 cm water pressure was 1 cm EPR revisit will be in one year from now. I would like a 20 minute visit duration. Reduce your alcohol intake to one drink with dinner. Reserve xanax as needed.        John Partridge Bazil Dhanani MD  09/27/2014

## 2014-10-02 ENCOUNTER — Other Ambulatory Visit: Payer: Self-pay | Admitting: Physician Assistant

## 2014-10-09 ENCOUNTER — Encounter: Payer: Self-pay | Admitting: Neurology

## 2014-11-23 ENCOUNTER — Telehealth: Payer: Self-pay | Admitting: Neurology

## 2014-11-23 NOTE — Telephone Encounter (Signed)
Pt called office complaining of awakening with air blowing out of his mouth consistently. Pt is open to the idea of changing current mask to full face mask. Please advise.

## 2014-11-24 ENCOUNTER — Encounter: Payer: Self-pay | Admitting: Neurology

## 2014-12-04 ENCOUNTER — Ambulatory Visit (INDEPENDENT_AMBULATORY_CARE_PROVIDER_SITE_OTHER): Payer: Worker's Compensation | Admitting: Family Medicine

## 2014-12-04 ENCOUNTER — Ambulatory Visit: Payer: PRIVATE HEALTH INSURANCE | Admitting: Family Medicine

## 2014-12-04 ENCOUNTER — Telehealth: Payer: Self-pay

## 2014-12-04 ENCOUNTER — Ambulatory Visit: Payer: Worker's Compensation

## 2014-12-04 VITALS — BP 130/90 | HR 75 | Temp 97.3°F | Resp 18 | Ht 70.0 in | Wt 208.0 lb

## 2014-12-04 DIAGNOSIS — S39012A Strain of muscle, fascia and tendon of lower back, initial encounter: Secondary | ICD-10-CM

## 2014-12-04 DIAGNOSIS — M5440 Lumbago with sciatica, unspecified side: Secondary | ICD-10-CM | POA: Diagnosis not present

## 2014-12-04 MED ORDER — HYDROCODONE-ACETAMINOPHEN 5-325 MG PO TABS: 1.0000 | ORAL_TABLET | Freq: Three times a day (TID) | ORAL | Status: DC | PRN

## 2014-12-04 MED ORDER — PREDNISONE 20 MG PO TABS: ORAL_TABLET | ORAL | Status: DC

## 2014-12-04 MED ORDER — KETOROLAC TROMETHAMINE 60 MG/2ML IM SOLN
60.0000 mg | Freq: Once | INTRAMUSCULAR | Status: AC
  Administered 2014-12-04: 60 mg via INTRAMUSCULAR

## 2014-12-04 MED ORDER — CYCLOBENZAPRINE HCL 10 MG PO TABS: 10.0000 mg | ORAL_TABLET | Freq: Two times a day (BID) | ORAL | Status: DC | PRN

## 2014-12-04 NOTE — Telephone Encounter (Signed)
Anybody recall these forms?

## 2014-12-04 NOTE — Telephone Encounter (Signed)
The patient came into [facility] building to state that the [place] [employer] forms that were faxed were not received in their entirety.  The patient requests that these forms be resent.  The patient may be reached at [phone].

## 2014-12-04 NOTE — Patient Instructions (Signed)
Rest and apply heat to your back.  Move around frequently to prevent stiffness Use the flexeril and/ or vicodin as needed for pain in your back.  Remember that both of these medications can make you feel sleepy- taking both together will make you more sleepy   You can start on the prednisone taper tomorrow.   Please come back and see [place] on Friday for a recheck.  Let me know sooner if you are getting worse.

## 2014-12-04 NOTE — Progress Notes (Signed)
John Preston Dec 12, 1955 59 y.o.   Chief Complaint  Patient presents with  . Back Pain    today   . Back Injury     Date of Injury: today  History of Present Illness:  Presents for evaluation of work-related complaint. This am at work he was picking up a box of wire- he does this daily in the course of his job.  He picked it up and turned to walk and put the box on a shelf- his back suddenly gave him severe pain. He fell to the ground and was in severe pain and unable to get up right away.  He was able to get up after about 15 minutes ; a co-worker helped him to a chair where he rested for 20 minutes.  He felt nauseated from the pain He works for an [employer] and is on his feet/ lifting all day  He has no medication allergies.   He has been lying in the room for a little while and now his back is quite stiff He has this happen about 7 years ago once; he did not require surgery and did recover.   He takes indomethacin as needed for MSK pain but none today.   No meds today while at work.    He has not noted any numbness or weakness in his legs, but the pain does go down both legs.    He has a history of lumbar DDD and has seen ortho on occasion for this. He has received steroid injections  No history of kidney issues  He does not have any bowel or bladder incontinence or genital numbness.    ROS    No Known Allergies   Current medications reviewed and updated. Past medical history, family history, social history have been reviewed and updated.   Physical Exam  GEN: WDWN, NAD, Non-toxic, A & O x 3, lying on his back in the room. After toradol he was able to get up for exam as below HEENT: Atraumatic, Normocephalic. Neck supple. No masses, No LAD. Ears and Nose: No external deformity. CV: RRR, No M/G/R. No JVD. No thrill. No extra heart sounds. PULM: CTA B, no wheezes, crackles, rhonchi. No retractions. No resp. distress. No accessory muscle use. ABD: S, NT,  ND, +BS. No rebound. No HSM. EXTR: No c/c/e NEURO moves stiffly and with back slightly flexed  PSYCH: Normally interactive. Conversant. Not depressed or anxious appearing.  Calm demeanor.  He cannot flex spine except minimally due to pain.  Limited extension.  Positive pain with SLR right more than left.  Normal BLE strength, sensation and DTR.  He indicates the bilateral lower back as the source of pain but this is not reproducible on exam   Given toradol 60 mg  This did help relieve his pain   [facility] reading (PRIMARY) by  Dr. Lorelei Pont. Lumbar spine: degenerative change at L4/5 and L5/S1  LUMBAR SPINE - COMPLETE 4+ VIEW  COMPARISON: None.  FINDINGS: No fracture. No spondylolisthesis.  Mild loss of disc height at L4-L5 with moderate loss of disc height at L5-S1. Small endplate osteophytes noted at L3-L4 through L5-S1.  Facet joints are well preserved as are the remaining disc spaces.  Soft tissues are unremarkable.  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 No fracture or acute finding.  Assessment and Plan: Midline low back pain with sciatica, sciatica laterality unspecified - Plan: ketorolac (TORADOL) injection 60 mg, DG Lumbar Spine Complete, HYDROcodone-acetaminophen (NORCO/VICODIN) 5-325 MG per  tablet, cyclobenzaprine (FLEXERIL) 10 MG tablet  Lumbar strain, initial encounter  Treat for acute lumbar strain as above.   He will be OOW until recheck on Friday.  Counseled regarding use of heat and prevention of stiffness, sedation with medications  Instructed to avoid NSAIDS while on prednisone

## 2014-12-04 NOTE — Telephone Encounter (Signed)
Oral airflow can sometimes be limited by using a chinstrap, it can be helpful to refit the mask. I would need his D and E information to contact the DME and ask for a refitting of the interface.

## 2014-12-08 ENCOUNTER — Ambulatory Visit (INDEPENDENT_AMBULATORY_CARE_PROVIDER_SITE_OTHER): Payer: Worker's Compensation | Admitting: Family Medicine

## 2014-12-08 VITALS — BP 120/82 | HR 82 | Temp 97.5°F | Resp 20 | Ht 70.0 in | Wt 207.6 lb

## 2014-12-08 DIAGNOSIS — M545 Low back pain, unspecified: Secondary | ICD-10-CM

## 2014-12-08 DIAGNOSIS — S39012D Strain of muscle, fascia and tendon of lower back, subsequent encounter: Secondary | ICD-10-CM | POA: Diagnosis not present

## 2014-12-08 NOTE — Progress Notes (Signed)
John Preston 1956-07-23 59 y.o.   Chief Complaint  Patient presents with  . Follow-up    back injury     Date of Injury: seen here on 5/2 with a back injury that occurred that day.    HPI from 5/2:This am at work he was picking up a box of wire- he does this daily in the course of his job. He picked it up and turned to walk and put the box on a shelf- his back suddenly gave him severe pain. He fell to the ground and was in severe pain and unable to get up right away. He was able to get up after about 15 minutes ; a co-worker helped him to a chair where he rested for 20 minutes. He felt nauseated from the pain  He was treated with a shot of toradol, flexeil, vicodin, prednisone.  He had x-rays that showed degenerative changes.Taken OOW until recheck here today   Here today for a recheck. He is "a little bit better."  He is still not near his baseline however.  He is still taking his prednisone.  He has plenty of flexeril left.   He took a hydrocodone this am for pain.    He notes that he is ok when laying flat, but when standing pain goes into his bilateral buttocks and into his thighs.  Sitting is ok but laying down is better. Heat helps some.   He does not feel that he is yet ready to RTW. "there is no way, at least not now. I've done this before a couple of times and it just takes time."    Standing for more than a few minutes will cause increasing pain in his back and legs.    No numbness or weakness in his legs, no problems with bowel or bladder control  He feels that he has gone from a "10 to a 7.5 or an 8"  History of Present Illness:  Presents for evaluation of work-related complaint.   ROS As above, OW he is well today   No Known Allergies   Current medications reviewed and updated. Past medical history, family history, social history have been reviewed and updated.   Physical Exam  GEN: WDWN, NAD, Non-toxic, A & O x 3, looks better today, able to sit in  chair HEENT: Atraumatic, Normocephalic. Neck supple. No masses, No LAD. Ears and Nose: No external deformity. CV: RRR, No M/G/R. No JVD. No thrill. No extra heart sounds. PULM: CTA B, no wheezes, crackles, rhonchi. No retractions. No resp. distress. No accessory muscle use. EXTR: No c/c/e NEURO Normal gait.  PSYCH: Normally interactive. Conversant. Not depressed or anxious appearing.  Calm demeanor.  Back: he has tightness and spasm in the lumbar muscles, no bony TTP. No rash or lesion Normal LE strength, sensation and DTR.  Negative SLR bilaterally His lumbar flexion and extension is still restricted but it is improved.    Assessment and Plan: Lumbar strain, subsequent encounter  Midline low back pain without sciatica   Improved but not resolved sx.  He will continue to be OOW, come in for a recheck in 5 days.  If still not able to RTW at least on light duty at that time will refer to ortho

## 2014-12-08 NOTE — Patient Instructions (Addendum)
Please continue to use your medications as needed.   Please come and see me on Wednesday for a recheck.

## 2014-12-13 ENCOUNTER — Ambulatory Visit (INDEPENDENT_AMBULATORY_CARE_PROVIDER_SITE_OTHER): Payer: Worker's Compensation | Admitting: Family Medicine

## 2014-12-13 VITALS — BP 120/88 | HR 85 | Temp 97.5°F | Resp 20 | Ht 70.0 in | Wt 204.0 lb

## 2014-12-13 DIAGNOSIS — S39012D Strain of muscle, fascia and tendon of lower back, subsequent encounter: Secondary | ICD-10-CM

## 2014-12-13 NOTE — Patient Instructions (Signed)
I am glad that you are doing better. Continue to use medications as needed Please see [place] in one week for a recheck.

## 2014-12-13 NOTE — Progress Notes (Signed)
John Preston 05-30-1956 59 y.o.   Chief Complaint  Patient presents with  . WC follow up     Date of Injury: 12/04/14  History of Present Illness:  Presents for evaluation of work-related complaint.  Here today for a WC injury recheck.  Injury occurred on 5/2 when he picked up a box of heavy wire at work.  Seen on 5/6 at which time he was a little bit better.  Here today for a recheck- he is improved again.   His lumbar flexion is much improved, and his pain is "like a dull ache, it's still sore but it's not like it was."   He notes that if he stands for a long time he will have more pain in his back and it did radiate into his bilateral thighs.   He is finished with prednisone- he is not using the flexeril and hydrocodone "all the time" any longer.   He is ok when he is sitting or laying down.   No leg numbness or weakness, no issues with bowel or bladder control.    ROS    No Known Allergies   Current medications reviewed and updated. Past medical history, family history, social history have been reviewed and updated.   Physical Exam  GEN: WDWN, NAD, Non-toxic, A & O x 3, looks well HEENT: Atraumatic, Normocephalic. Neck supple. No masses, No LAD. Ears and Nose: No external deformity. CV: RRR, No M/G/R. No JVD. No thrill. No extra heart sounds. PULM: CTA B, no wheezes, crackles, rhonchi. No retractions. No resp. distress. No accessory muscle use. ABD: S, NT, ND, +BS. No rebound. No HSM. EXTR: No c/c/e NEURO Normal gait.  PSYCH: Normally interactive. Conversant. Not depressed or anxious appearing.  Calm demeanor.  Normal BLE strength, sensation and DTR.  Negative SLR bilaterally Lumbar flexion is minimally restricted.  No tenderness to palpation at this time   Assessment and Plan:  Lumbar strain, subsequent encounter  Improving, he is now using medication just as needed  RTW with restriction (no lifting over 5lbs) and RTC in one week

## 2014-12-23 ENCOUNTER — Telehealth: Payer: Self-pay | Admitting: Family Medicine

## 2014-12-23 NOTE — Telephone Encounter (Signed)
LMOM for patient to reschedule his appt on 8/17-16  At 8 to another time or date Dr Tamala Julian will be in a meeting early that morning

## 2014-12-26 ENCOUNTER — Ambulatory Visit (INDEPENDENT_AMBULATORY_CARE_PROVIDER_SITE_OTHER): Payer: Worker's Compensation | Admitting: Family Medicine

## 2014-12-26 VITALS — BP 152/90 | HR 78 | Temp 97.3°F | Resp 16 | Ht 70.0 in | Wt 204.0 lb

## 2014-12-26 DIAGNOSIS — S39012D Strain of muscle, fascia and tendon of lower back, subsequent encounter: Secondary | ICD-10-CM

## 2014-12-26 DIAGNOSIS — R030 Elevated blood-pressure reading, without diagnosis of hypertension: Secondary | ICD-10-CM

## 2014-12-26 DIAGNOSIS — Z79899 Other long term (current) drug therapy: Secondary | ICD-10-CM

## 2014-12-26 DIAGNOSIS — M5440 Lumbago with sciatica, unspecified side: Secondary | ICD-10-CM

## 2014-12-26 DIAGNOSIS — M51369 Other intervertebral disc degeneration, lumbar region without mention of lumbar back pain or lower extremity pain: Secondary | ICD-10-CM

## 2014-12-26 DIAGNOSIS — IMO0001 Reserved for inherently not codable concepts without codable children: Secondary | ICD-10-CM

## 2014-12-26 DIAGNOSIS — M5136 Other intervertebral disc degeneration, lumbar region: Secondary | ICD-10-CM

## 2014-12-26 DIAGNOSIS — M5416 Radiculopathy, lumbar region: Secondary | ICD-10-CM

## 2014-12-26 MED ORDER — HYDROCODONE-ACETAMINOPHEN 5-325 MG PO TABS: 1.0000 | ORAL_TABLET | Freq: Three times a day (TID) | ORAL | Status: DC | PRN

## 2014-12-26 MED ORDER — INDOMETHACIN 25 MG PO CAPS: 50.0000 mg | ORAL_CAPSULE | Freq: Two times a day (BID) | ORAL | Status: DC

## 2014-12-26 MED ORDER — CYCLOBENZAPRINE HCL 10 MG PO TABS: 10.0000 mg | ORAL_TABLET | Freq: Two times a day (BID) | ORAL | Status: DC | PRN

## 2014-12-26 NOTE — Patient Instructions (Signed)
Sciatica Sciatica is pain, weakness, numbness, or tingling along the path of the sciatic nerve. The nerve starts in the lower back and runs down the back of each leg. The nerve controls the muscles in the lower leg and in the back of the knee, while also providing sensation to the back of the thigh, lower leg, and the sole of your foot. Sciatica is a symptom of another medical condition. For instance, nerve damage or certain conditions, such as a herniated disk or bone spur on the spine, pinch or put pressure on the sciatic nerve. This causes the pain, weakness, or other sensations normally associated with sciatica. Generally, sciatica only affects one side of the body. CAUSES   Herniated or slipped disc.  Degenerative disk disease.  A pain disorder involving the narrow muscle in the buttocks (piriformis syndrome).  Pelvic injury or fracture.  Pregnancy.  Tumor (rare). SYMPTOMS  Symptoms can vary from mild to very severe. The symptoms usually travel from the low back to the buttocks and down the back of the leg. Symptoms can include:  Mild tingling or dull aches in the lower back, leg, or hip.  Numbness in the back of the calf or sole of the foot.  Burning sensations in the lower back, leg, or hip.  Sharp pains in the lower back, leg, or hip.  Leg weakness.  Severe back pain inhibiting movement. These symptoms may get worse with coughing, sneezing, laughing, or prolonged sitting or standing. Also, being overweight may worsen symptoms. DIAGNOSIS  Your caregiver will perform a physical exam to look for common symptoms of sciatica. He or she may ask you to do certain movements or activities that would trigger sciatic nerve pain. Other tests may be performed to find the cause of the sciatica. These may include:  Blood tests.  X-rays.  Imaging tests, such as an MRI or CT scan. TREATMENT  Treatment is directed at the cause of the sciatic pain. Sometimes, treatment is not necessary  and the pain and discomfort goes away on its own. If treatment is needed, your caregiver may suggest:  Over-the-counter medicines to relieve pain.  Prescription medicines, such as anti-inflammatory medicine, muscle relaxants, or narcotics.  Applying heat or ice to the painful area.  Steroid injections to lessen pain, irritation, and inflammation around the nerve.  Reducing activity during periods of pain.  Exercising and stretching to strengthen your abdomen and improve flexibility of your spine. Your caregiver may suggest losing weight if the extra weight makes the back pain worse.  Physical therapy.  Surgery to eliminate what is pressing or pinching the nerve, such as a bone spur or part of a herniated disk. HOME CARE INSTRUCTIONS   Only take over-the-counter or prescription medicines for pain or discomfort as directed by your caregiver.  Apply ice to the affected area for 20 minutes, 3-4 times a day for the first 48-72 hours. Then try heat in the same way.  Exercise, stretch, or perform your usual activities if these do not aggravate your pain.  Attend physical therapy sessions as directed by your caregiver.  Keep all follow-up appointments as directed by your caregiver.  Do not wear high heels or shoes that do not provide proper support.  Check your mattress to see if it is too soft. A firm mattress may lessen your pain and discomfort. SEEK IMMEDIATE MEDICAL CARE IF:   You lose control of your bowel or bladder (incontinence).  You have increasing weakness in the lower back, pelvis, buttocks,   or legs.  You have redness or swelling of your back.  You have a burning sensation when you urinate.  You have pain that gets worse when you lie down or awakens you at night.  Your pain is worse than you have experienced in the past.  Your pain is lasting longer than 4 weeks.  You are suddenly losing weight without reason. MAKE SURE YOU:  Understand these  instructions.  Will watch your condition.  Will get help right away if you are not doing well or get worse. Document Released: 07/15/2001 Document Revised: 01/20/2012 Document Reviewed: 11/30/2011 Exit[employer] Patient Information 2015 [employer]. This information is not intended to replace advice given to you by your health care provider. Make sure you discuss any questions you have with your health care provider.  

## 2014-12-26 NOTE — Progress Notes (Signed)
Subjective:  This chart was scribed for Delman Cheadle, MD by Leandra Kern, Medical Scribe. This patient was seen in Room 14 and the patient's care was started at 2:17 PM.   Patient ID: John Preston, male    DOB: [birth year], 59 y.o.   MRN: [id number]  Chief Complaint  Patient presents with  . Follow-up    workers comp recheck back     HPI HPI Comments: John Preston is a 59 y.o. male who presents to [facility] for a follow up after a WC back injury that occurred on 12/03/13. Patient was initially seen for low back lumbar strain with sciatica three weeks ago on 12/03/13. He was picking up a box and twisting his trunk at the same time. This caused severe, sharp back pain which caused him to fall to the ground. Pt was unable to get up for 15 minutes and could not walk for another 20 minutes. Patient was presented with severe pain which caused nausea. He was given tramadol here in the office, and x ray showed DDD in L5S1 at that visit. Patient was taken out of work for 4 days, and was put on Prednisone taper, Flexeril, and hydrocodone PRN. Patient was then seen 4 days later, his condition was slightly improved. Pain still at best when laying flat, worst when standing. Patient was out of work for another 5 days. At his last visit, patient was mildly improving, but pain radiated down both legs with standing. He was able to return to work with restrictions not to carry over 5 Ib weight.   Today, patient reports going back to work on the 12/13/13. He reports working all of last week without much of physical exertion. However, patient notes that during the past 4 days the pain to his lower back has returned without known injury. Pt missed work yesterday due to severity of pain but went back to work today. Patient states that the pain radiates to the front of his thighs simultaneously, but does not radiate to his knees. Pain is improved with sitting and lying down. He also reports intermittent  muscle spasms in his back. Patient indicates that he is compliant with taking Flexeril and is out of his Hydrocodone. Patient reports similar WC back injury 7 years ago. The pain has resolved over time, however, he intermittently experiences similar spasms which he receives cortisone injections on his spine by Raliegh Ip. Pt's PCP [name], MD has perscriped him indomethacin in the past for back pain. He expresses his desire to return to work.    Current Outpatient Prescriptions on File Prior to Visit  Medication Sig Dispense Refill  . cyclobenzaprine (FLEXERIL) 10 MG tablet Take 1 tablet (10 mg total) by mouth 2 (two) times daily as needed for muscle spasms. 30 tablet 0  . escitalopram (LEXAPRO) 20 MG tablet TAKE 1 TABLET BY MOUTH DAILY 30 tablet 5  . Multiple Vitamin (MULTIVITAMIN) tablet Take 1 tablet by mouth daily.    . simvastatin (ZOCOR) 80 MG tablet Take 0.5 tablets (40 mg total) by mouth at bedtime. 30 tablet 5  . SUMAtriptan (IMITREX) 50 MG tablet Take 1 tablet (50 mg total) by mouth 4 (four) times daily as needed for migraine or headache. May repeat in 2 hours after 1st dose  if headache persists or recurs. Not to exceed 300 mg in 24 hours 10 tablet 5  . acyclovir (ZOVIRAX) 800 MG tablet Take 1 tablet (800 mg total) by mouth 3 (three)  times daily. (Patient not taking: Reported on 12/26/2014) 30 tablet 4  . HYDROcodone-acetaminophen (NORCO/VICODIN) 5-325 MG per tablet Take 1-2 tablets by mouth every 8 (eight) hours as needed. (Patient not taking: Reported on 12/26/2014) 20 tablet 0  . indomethacin (INDOCIN) 25 MG capsule Take 1 capsule (25 mg total) by mouth 2 (two) times daily with a meal. (Patient not taking: Reported on 12/26/2014) 60 capsule 3   No current facility-administered medications on file prior to visit.   No Known Allergies  Review of Systems  Constitutional: Positive for activity change. Negative for fever and chills.  Gastrointestinal: Negative for nausea, vomiting,  abdominal pain, diarrhea and constipation.  Genitourinary: Negative for urgency, frequency, decreased urine volume and difficulty urinating.  Musculoskeletal: Positive for myalgias, back pain and arthralgias. Negative for joint swelling and gait problem.  Skin: Negative for color change and wound.  Neurological: Negative for dizziness, weakness, light-headedness and numbness.  Psychiatric/Behavioral: Positive for sleep disturbance.   BP 152/90 mmHg  Pulse 78  Temp(Src) 97.3 F (36.3 C) (Oral)  Resp 16  Ht 5\' 10"  (1.778 m)  Wt 204 lb (92.534 kg)  BMI 29.27 kg/m2  SpO2 98%    Objective:   Physical Exam  Constitutional: He is oriented to person, place, and time. He appears well-developed and well-nourished. No distress.  HENT:  Head: Normocephalic and atraumatic.  Mouth/Throat: Oropharynx is clear and moist. No oropharyngeal exudate.  Eyes: Pupils are equal, round, and reactive to light.  Neck: Neck supple.  Cardiovascular: Normal rate.   Pulmonary/Chest: Effort normal.  Musculoskeletal: He exhibits no edema.  Central lower lumbar spine and lumbosacral tenderness. 5/5 lower extremity strength. No focal tenderness to paraspinal or lumbosacral regions.    Neurological: He is alert and oriented to person, place, and time. He has normal reflexes. No cranial nerve deficit.  Skin: Skin is warm and dry. No rash noted.  Psychiatric: He has a normal mood and affect. His behavior is normal.  Nursing note and vitals reviewed.     Assessment & Plan:   1. Lumbar strain, subsequent encounter   2. Elevated blood pressure -   3. Polypharmacy   4. Degenerative disc disease, lumbar   5. Lumbar radiculopathy, acute   6. Midline low back pain with sciatica, sciatica laterality unspecified     Orders Placed This Encounter  Procedures  . MR Lumbar Spine Wo Contrast    Standing Status: Future     Number of Occurrences:      Standing Expiration Date: 02/25/2016    Order Specific Question:   Reason for Exam (SYMPTOM  OR DIAGNOSIS REQUIRED)    Answer:  worsening lumbar pain with bilateral radiculopathy    Order Specific Question:  Preferred imaging location?    Answer:  [id number]    Order Specific Question:  Does the patient have a pacemaker or implanted devices?    Answer:  No    Order Specific Question:  What is the patient's sedation requirement?    Answer:  No Sedation    Meds ordered this encounter  Medications  . HYDROcodone-acetaminophen (NORCO/VICODIN) 5-325 MG per tablet    Sig: Take 1-2 tablets by mouth every 8 (eight) hours as needed.    Dispense:  40 tablet    Refill:  0  . cyclobenzaprine (FLEXERIL) 10 MG tablet    Sig: Take 1 tablet (10 mg total) by mouth 2 (two) times daily as needed for muscle spasms.    Dispense:  30 tablet  Refill:  0  . indomethacin (INDOCIN) 25 MG capsule    Sig: Take 2 capsules (50 mg total) by mouth 2 (two) times daily with a meal.    Dispense:  60 capsule    Refill:  1    I personally performed the services described in this documentation, which was scribed in my presence. The recorded information has been reviewed and considered, and addended by me as needed.  Delman Cheadle, MD MPH

## 2015-01-02 ENCOUNTER — Ambulatory Visit (INDEPENDENT_AMBULATORY_CARE_PROVIDER_SITE_OTHER): Payer: Worker's Compensation | Admitting: Family Medicine

## 2015-01-02 VITALS — BP 118/70 | HR 90 | Temp 98.4°F | Resp 16 | Ht 70.0 in | Wt 208.4 lb

## 2015-01-02 DIAGNOSIS — S39012D Strain of muscle, fascia and tendon of lower back, subsequent encounter: Secondary | ICD-10-CM | POA: Diagnosis not present

## 2015-01-02 NOTE — Progress Notes (Addendum)
Subjective:    Patient ID: John Preston, male    DOB: [birth year], 59 y.o.   MRN: [id number]  HPI John Preston is a 59 y.o. male who presents to [facility] for a follow up after a WC back injury that occurred on 12/03/13.  In summary, patient was initially seen for low back lumbar strain with sciatica on 12/03/13. He was picking up a box and twisting his trunk at the same time. This caused severe, sharp back pain which caused him to fall to the ground. Pt was unable to get up for 15 minutes and could not walk for another 20 minutes. Patient was presented with severe pain which caused nausea. He was given tramadol here in the office, and x ray showed DDD in L5S1 at that visit. Patient was taken out of work for 4 days, and was put on Prednisone taper, Flexeril, and hydrocodone PRN. Patient was then seen 4 days later, his condition was slightly improved. He was able to return to work with restrictions not to carry over 5 Ib weight. Patient reports similar WC back injury 7 years ago. The pain has resolved over time, however, he intermittently experiences similar spasms which he receives cortisone injections on his spine by Raliegh Ip. Pt's PCP [name], MD has perscriped him indomethacin in the past for back pain. He was last seen 1 week ago where his medications were refilled including indomethacin, muscle relaxant, and hydrocodone. An MRI was ordered, but does not appear to have been completed.  Today, he feels that he is improving. He says that the work restriction given last week to avoid any bending, lifting, or twisting has helped. However, he feels that he is ready to begin some light duty. He works in a [employer] and occasionally needs to [employer]. Sometimes, these objects can be up to 50 pounds, which he says he doesn't feel ready to be able to left. He is taking indomethacin during the day, and occasional Flexeril at night. He also has Norco, but he is not taking this  frequently. He says that the radicular symptoms have resolved. He denies any loss of bladder or bowel, saddle anesthesia, or lower extremity weakness. Location of pain is bilateral lower back. Character is a tightness. Symptoms are aggravated with significant frequent bending or twisting.  Past medical history, social history, medications, and allergies were reviewed and are up to date in the chart.  Review of Systems 7 point review of systems was performed and was otherwise negative unless noted in the history of present illness.     Objective:   Physical Exam BP 118/70 mmHg  Pulse 90  Temp(Src) 98.4 F (36.9 C) (Oral)  Resp 16  Ht 5\' 10"  (1.778 m)  Wt 208 lb 6 oz (94.518 kg)  BMI 29.90 kg/m2  SpO2 98% GEN: The patient is well-developed well-nourished male and in no acute distress.  He is awake alert and oriented x3. SKIN: warm and well-perfused, no rash  Neuro: Strength 5/5 globally. Sensation intact throughout. DTRs 2/4 bilaterally. No focal deficits. Vasc: +2 bilateral distal pulses. No edema.  MSK: Examination of the lumbar spine reveals flexion to 90. Extension to 30. Rotation to 60 bilaterally. Side bending to 20 bilaterally. Palpable spasm is palpated from the L3-S1 region bilaterally. No bony tenderness. No tenderness at the SI joints. Negative seated straight leg raise. He is neurovascularly intact distally.     Assessment & Plan:  1. Lumbar strain, improving 2. Lumbar radiculopathy,  resolved 3. Degenerative disc disease of lumbar spine  -Treatment options were again discussed with the patient. He'll continue his current medication regimen and did not request any refills today. -Advance work restrictions to include a 20 pound lifting restriction and allow him to twist and bend if needed within this weight restriction -I offered, but the patient declined formal PT today, saying he has done this repeatedly in the past and knows the home exercises and stretches to do  already. -He will follow-up in 3 weeks for repeat evaluation, and if he is doing well we may consider advancing him to full duty. -If his symptoms acutely worsen he will return sooner and we may consider an MRI of the lumbar spine.  Dr. Linnell Fulling, DO Sports Medicine Fellow   Agree with assessment and plan.  To RTC in 3 weeks.  Fenton Malling. Linna Darner MD Attending 01/02/2015

## 2015-01-02 NOTE — Patient Instructions (Signed)
Plan follow-up in 3 weeks or sooner if needed.

## 2015-01-23 ENCOUNTER — Ambulatory Visit (INDEPENDENT_AMBULATORY_CARE_PROVIDER_SITE_OTHER): Payer: Worker's Compensation | Admitting: Emergency Medicine

## 2015-01-23 VITALS — BP 110/72 | HR 80 | Temp 98.2°F | Resp 18 | Wt 200.0 lb

## 2015-01-23 DIAGNOSIS — S39012D Strain of muscle, fascia and tendon of lower back, subsequent encounter: Secondary | ICD-10-CM

## 2015-01-23 NOTE — Patient Instructions (Signed)

## 2015-01-23 NOTE — Progress Notes (Signed)
Subjective:  Patient ID: John Preston, male    DOB: 19-Jan-1956  Age: 59 y.o. MRN: [id number]  CC: Follow-up   HPI John Preston presents  for follow-up of a workman's comp back injury. He was injured initially in the first week in May has been seen several times. He's had increasing work abilities. He now comes the office requesting is workup restrictions be eliminated. He said he has no pain at full mobility and is not taking any medication   History John Preston has a past medical history of Pain in joint, shoulder region; Other specified viral warts; Pure hypercholesterolemia; Genital herpes, unspecified; Other specified inflammatory disease of prostate; Unspecified hearing loss; Insomnia, unspecified; Encounter for therapeutic drug monitoring; Lumbago; Internal hemorrhoids without mention of complication; Anxiety state, unspecified; Unspecified vitamin D deficiency; Basal cell carcinoma of skin, site unspecified; Genital herpes; Depression; Arthritis; Internal hemorrhoids; 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   He has past surgical history that includes Knee surgery (1991); Prostate surgery (2010); L. facial basal cell carcinoma resection (11/2010); Colonoscopy (08/04/2008); and Vasectomy.   His  family history includes COPD in his mother; Cancer in his maternal grandmother; Dementia in his father; Diabetes in his maternal grandmother and mother; Heart disease (age of onset: 63) in his mother; Lung disease in an other family member; Mental illness in his father; Tics in his father.  He   reports that he has never smoked. He has never used smokeless tobacco. He reports that he drinks about 9.0 oz of alcohol per week. He reports that he does not use illicit drugs.  Outpatient Prescriptions Prior to Visit  Medication Sig Dispense Refill  . acyclovir (ZOVIRAX) 800 MG tablet Take 1 tablet (800 mg total) by  mouth 3 (three) times daily. 30 tablet 4  . ALPRAZolam (XANAX) 1 MG tablet Take 1 mg by mouth at bedtime as needed.  5  . escitalopram (LEXAPRO) 20 MG tablet TAKE 1 TABLET BY MOUTH DAILY 30 tablet 5  . Multiple Vitamin (MULTIVITAMIN) tablet Take 1 tablet by mouth daily.    . simvastatin (ZOCOR) 80 MG tablet Take 0.5 tablets (40 mg total) by mouth at bedtime. 30 tablet 5  . SUMAtriptan (IMITREX) 50 MG tablet Take 1 tablet (50 mg total) by mouth 4 (four) times daily as needed for migraine or headache. May repeat in 2 hours after 1st dose  if headache persists or recurs. Not to exceed 300 mg in 24 hours 10 tablet 5  . cyclobenzaprine (FLEXERIL) 10 MG tablet Take 1 tablet (10 mg total) by mouth 2 (two) times daily as needed for muscle spasms. (Patient not taking: Reported on 01/23/2015) 30 tablet 0  . HYDROcodone-acetaminophen (NORCO/VICODIN) 5-325 MG per tablet Take 1-2 tablets by mouth every 8 (eight) hours as needed. (Patient not taking: Reported on 01/23/2015) 40 tablet 0  . indomethacin (INDOCIN) 25 MG capsule Take 2 capsules (50 mg total) by mouth 2 (two) times daily with a meal. (Patient not taking: Reported on 01/23/2015) 60 capsule 1   No facility-administered medications prior to visit.    History   Social History  . Marital Status: Married    Spouse Name: N/A  . Number of Children: 2  . Years of Education: N/A   Occupational History  . Sales    Social History Main Topics  . Smoking status: Never Smoker   . Smokeless tobacco: Never Used  . Alcohol Use: 9.0 oz/week  15 Glasses of wine per week     Comment: moderate 1-2 glasses of wine per day; more on weekends.  . Drug Use: No     Comment: prevoiusly used marijuana  . Sexual Activity: Yes   Other Topics Concern  . None   Social History Narrative   Marital status:  Married x 32 years, happily.      Children: two adult children (John Preston and John Preston); no grandchildren.      Lives: with wife.      Tobacco:  None       Alcohol:    2 glasses of wine per night.  Usually 3 on weekends.  Some beer.      Drugs: none      Exercise: light; job very physically demanding.       Seatbelts:  Always uses seat belts.       Home safety:  Smoke alarm in the home. Carbon monoxide detector in the home.       Guns:  Guns in the home not stored in locked cabinet.       Caffeine use: 2 servings Coffee per day.     Review of Systems  Constitutional: Negative for fever, chills and appetite change.  HENT: Negative for congestion, ear pain, postnasal drip, sinus pressure and sore throat.   Eyes: Negative for pain and redness.  Respiratory: Negative for cough, shortness of breath and wheezing.   Cardiovascular: Negative for leg swelling.  Gastrointestinal: Negative for nausea, vomiting, abdominal pain, diarrhea, constipation and blood in stool.  Endocrine: Negative for polyuria.  Genitourinary: Negative for dysuria, urgency, frequency and flank pain.  Musculoskeletal: Negative for gait problem.  Skin: Negative for rash.  Neurological: Negative for weakness and headaches.  Psychiatric/Behavioral: Negative for confusion and decreased concentration. The patient is not nervous/anxious.     Objective:  BP 110/72 mmHg  Pulse 80  Temp(Src) 98.2 F (36.8 C) (Oral)  Resp 18  Wt 200 lb (90.719 kg)  SpO2 99%  Physical Exam  Constitutional: He is oriented to person, place, and time. He appears well-developed and well-nourished.  HENT:  Head: Normocephalic and atraumatic.  Eyes: Conjunctivae are normal. Pupils are equal, round, and reactive to light.  Pulmonary/Chest: Effort normal.  Musculoskeletal: He exhibits no edema.  Neurological: He is alert and oriented to person, place, and time.  Skin: Skin is dry.  Psychiatric: He has a normal mood and affect. His behavior is normal. Thought content normal.      Assessment & Plan:   Jamine was seen today for follow-up.  Diagnoses and all orders for this visit:  Lumbar strain,  subsequent encounter   I am having Mr. Labell maintain his multivitamin, acyclovir, SUMAtriptan, escitalopram, simvastatin, HYDROcodone-acetaminophen, cyclobenzaprine, indomethacin, and ALPRAZolam.  No orders of the defined types were placed in this encounter.   He was returned to full duty at work. Follow-up as needed   Appropriate red flag conditions were discussed with the patient as well as actions that should be taken.  Patient expressed his understanding.  Follow-up: Return if symptoms worsen or fail to improve.  Roselee Culver, MD

## 2015-02-09 ENCOUNTER — Telehealth: Payer: Self-pay | Admitting: Neurology

## 2015-02-09 NOTE — Telephone Encounter (Signed)
Patient called and requested to speak with the nurse regarding his FMLA paperwork. Please call and advise.

## 2015-02-12 NOTE — Telephone Encounter (Signed)
Returned pt's phone call. No answer, left a message asking him to call me back.

## 2015-02-12 NOTE — Telephone Encounter (Signed)
Spoke with pt for over 10 minutes regarding his FMLA paperwork. He is concerned because he doesn't want his employer to receive copies of his MR with the FMLA paperwork. I explained to him that without his MR, his FMLA may be denied, and those rules and regulations are not made by [place]. He says he has missed so much time over the past 6 months for his migraines that he has no sick/vaction time left. I offered an appt to see Dr. Brett Fairy and one was made on 8/25. I told pt that I would forward this message to MR because they know more about what is needed for FMLA than I do, and they would call him back with a further explanation.

## 2015-02-20 ENCOUNTER — Telehealth: Payer: Self-pay | Admitting: Family Medicine

## 2015-02-20 ENCOUNTER — Encounter: Payer: Self-pay | Admitting: Family Medicine

## 2015-02-20 ENCOUNTER — Ambulatory Visit (INDEPENDENT_AMBULATORY_CARE_PROVIDER_SITE_OTHER): Payer: PRIVATE HEALTH INSURANCE | Admitting: Family Medicine

## 2015-02-20 VITALS — BP 128/68 | HR 75 | Temp 97.8°F | Resp 16 | Wt 207.0 lb

## 2015-02-20 DIAGNOSIS — R079 Chest pain, unspecified: Secondary | ICD-10-CM

## 2015-02-20 DIAGNOSIS — R197 Diarrhea, unspecified: Secondary | ICD-10-CM

## 2015-02-20 DIAGNOSIS — R110 Nausea: Secondary | ICD-10-CM

## 2015-02-20 DIAGNOSIS — R5382 Chronic fatigue, unspecified: Secondary | ICD-10-CM | POA: Diagnosis not present

## 2015-02-20 LAB — POCT CBC
Granulocyte percent: 67.1 %G (ref 37–80)
HCT, POC: 46.5 % (ref 43.5–53.7)
HEMOGLOBIN: 16.1 g/dL (ref 14.1–18.1)
LYMPH, POC: 1.6 (ref 0.6–3.4)
MCH: 31.9 pg — AB (ref 27–31.2)
MCHC: 34.6 g/dL (ref 31.8–35.4)
MCV: 92.1 fL (ref 80–97)
MID (cbc): 0.3 (ref 0–0.9)
MPV: 6.5 fL (ref 0–99.8)
POC Granulocyte: 3.9 (ref 2–6.9)
POC LYMPH PERCENT: 27.4 %L (ref 10–50)
POC MID %: 5.5 % (ref 0–12)
Platelet Count, POC: 242 10*3/uL (ref 142–424)
RBC: 5.05 M/uL (ref 4.69–6.13)
RDW, POC: 12.4 %
WBC: 5.8 10*3/uL (ref 4.6–10.2)

## 2015-02-20 MED ORDER — ONDANSETRON HCL 8 MG PO TABS: 8.0000 mg | ORAL_TABLET | Freq: Three times a day (TID) | ORAL | Status: DC | PRN

## 2015-02-20 NOTE — Telephone Encounter (Signed)
Pt has dropped off FMLA forms. States when Dr Tamala Julian did forms last time no fee was charged. I had patient read our FMLA form, but did not collect pmt (pt very frustrated currently with an appt situation) it fee applicable, pt will need to pay when he picks up forms

## 2015-02-20 NOTE — Patient Instructions (Signed)
Try bland diet for next 24-48 hours I have sent an antinausea medication to your pharmacy.

## 2015-02-20 NOTE — Progress Notes (Signed)
Subjective:    Patient ID: John Preston, male    DOB: Nov 10, 1955, 59 y.o.   MRN: [id number]  HPI This is a pleasant 59 yo male patient of Dr. Tamala Julian. He presents today with complaint of fatigue and recurrent headaches. He awoke yesterday with a really bad headache. It was different from his regular migraine. He did not have phono/ photosensitivity like he normally does. He had accompanying nausea and diarrhea. He had two episodes of diarrhea yesterday, none today. No vomiting. Continues to feel "queasy," but has been able to eat normally yesterday and today. He has been sleeping almost continuously for the last two days. He used his CPAP last night. No one else in family sick.   Generalized fatigue for about a year. Has difficulty getting up and getting going in the morning. CPAP seemed to help for a couple of months. He feels "really down." He has a 14 year old daughter that lives with he and his wife. His daughter has suffered for many years with severe depression and cutting. She has attempted suicide twice. She is employed, but the patient feels like his daughter is unable to live independently. He reports that he "is tired from worry." His wife and daughter see a therapist, but the patient reports that he doesn't think it would help him, "because I have my wife." He reports a good relationship with his wife and a good, non-stressful work environment. He has been concerned about missing so much work recently with his headaches and a back injury. He states that he has missed work on occasion because he could not get up due to his fatigue. He does not exercise regularly, but he is very active at his job. He has been on Lexapro for many years and is not sure if it is still as effective. Had normal CBC, TSH, CMP 2/16.   Expresses frustration regarding his headaches and inability to get relief with sumatriptan and other medications. He can not identify any triggers and has not tried elimination diet  because," there are so many things that could cause them."  Patient has had 3-4 episodes of sub sternal chest pain in the last month that radiated to his neck. Lasted for a few minutes. Never with activity. Felt like acid indigestion, had some relief with drinking water. No nausea, no diaphoresis. No family history CAD. He has never smoked. He is on simvastatin for hypercholesterolemia with good control.   Review of Systems As per HPI    Objective:   Physical Exam Physical Exam  Constitutional: Oriented to person, place, and time. He appears well-developed and well-nourished.  HENT:  Head: Normocephalic and atraumatic.  Eyes: Conjunctivae are normal.  Neck: Normal range of motion. Neck supple.  Cardiovascular: Normal rate, regular rhythm and normal heart sounds.   Pulmonary/Chest: Effort normal and breath sounds normal.  Musculoskeletal: Normal range of motion.  Neurological: Alert and oriented to person, place, and time.  Skin: Skin is warm and dry.  Psychiatric: Normal mood and affect. Behavior is normal. Judgment and thought content normal.  Vitals reviewed.  BP 128/68 mmHg  Pulse 75  Temp(Src) 97.8 F (36.6 C) (Oral)  Resp 16  Wt 207 lb (93.895 kg) Wt Readings from Last 3 Encounters:  02/20/15 207 lb (93.895 kg)  01/23/15 200 lb (90.719 kg)  01/02/15 208 lb 6 oz (94.518 kg)  EKG reviewed with Dr. Joseph Art- Sinus Bradycardia, rate 55, normal  Results for orders placed or performed in visit on 02/20/15  POCT CBC  Result Value Ref Range   WBC 5.8 4.6 - 10.2 K/uL   Lymph, poc 1.6 0.6 - 3.4   POC LYMPH PERCENT 27.4 10 - 50 %L   MID (cbc) 0.3 0 - 0.9   POC MID % 5.5 0 - 12 %M   POC Granulocyte 3.9 2 - 6.9   Granulocyte percent 67.1 37 - 80 %G   RBC 5.05 4.69 - 6.13 M/uL   Hemoglobin 16.1 14.1 - 18.1 g/dL   HCT, POC 46.5 43.5 - 53.7 %   MCV 92.1 80 - 97 fL   MCH, POC 31.9 (A) 27 - 31.2 pg   MCHC 34.6 31.8 - 35.4 g/dL   RDW, POC 12.4 %   Platelet Count, POC 242.0 142  - 424 K/uL   MPV 6.5 0 - 99.8 fL       Assessment & Plan:  1. Chest pain, unspecified chest pain type - EKG 12-Lead - reviewed with Dr. Joseph Art - recommended cardiology consult to patient. He wishes to talk to Dr. Tamala Julian about this at his follow up visit 03/26/15. I instructed him to go to ER if he has recurrence of pain.   2. Diarrhea - POCT CBC- white count normal, suspect viral illness as it has resolved today.   3. Chronic fatigue - POCT CBC - I suspect some or all of this is from his stressful situation with his daughter's illness and recurrent migraines. Patient became more animated as visit progressed.  - Discussed possibly changing his lexapro when he follows up with Dr. Tamala Julian next month.   4. Nausea without vomiting - ondansetron (ZOFRAN) 8 MG tablet; Take 1 tablet (8 mg total) by mouth every 8 (eight) hours as needed for nausea or vomiting.  Dispense: 20 tablet; Refill: 0   Clarene Reamer, FNP-BC  [facility]  02/20/2015 4:49 PM   Chart, EKG and case discussed.  Agree with plan Robyn Haber, MD

## 2015-02-22 NOTE — Telephone Encounter (Signed)
Received paperwork for FMLA, filled out all that I could. Will place in Dr. Thompson Caul box to be completed, please return to FMLA/Disability box when you are finished. Placing in your box on 02/22/15.

## 2015-02-25 NOTE — Telephone Encounter (Signed)
Please clarify with patient---- Why is patient needing FMLA forms (i.e. For what health conditions)?  I see that Dr. Edwena Felty office was requested to complete FMLA paperwork for patient on 02/09/15.  I also see that patient has recently had a Workman's Compensation back injury that would not warrant FMLA paperwork.

## 2015-03-07 NOTE — Telephone Encounter (Signed)
Have we followed up with the patient or completed the forms?

## 2015-03-08 NOTE — Telephone Encounter (Signed)
He has appointment with you on 8/22 at 11:00

## 2015-03-08 NOTE — Telephone Encounter (Signed)
He states he needs FMLA for migraines. He states Dr. Fernande Bras for sleep apnea. So he would like for Dr. Tamala Julian to fill out the paperwork.

## 2015-03-19 NOTE — Telephone Encounter (Signed)
Patient called an left message on FMLA VM about his paperwork. He wants to know if his forms will be ready at his appointment on 03/26/2015. I assume they should be ready by then since it has been one month since we have had patient's forms (we received them on the 02/20/2015 and they were placed in Dr. Thompson Caul box on 02/22/2015). Patient states that his employer needs these forms ASAP.

## 2015-03-20 NOTE — Telephone Encounter (Signed)
Call patient --- this is my misunderstanding.  Pt's migraines and morning headaches have been related to sleep apnea int he past.  I have not seen patient in six months, so I do not know how many migraines he is having currently thus I cannot complete these forms without an office visit.  Also, I would expect his migraines to be much better since his sleep apnea is treated.  I need to see the patient to complete the forms.  I was holding the forms for his appointment next week.  I also see that patient has requested FMLA paperwork from Dr. Brett Fairy for headaches/migraines as well.  Dr. Brett Fairy is a neurologist who manages sleep apnea and migraines.  If he is missing a lot of work due to migraines, he should have been seen before now.  He can see me at the walk in clinic this Thursday night 4-8 or Saturday 8-4 if he needs paperwork before his visit next week.

## 2015-03-20 NOTE — Telephone Encounter (Signed)
Left message for pt to call back  °

## 2015-03-21 ENCOUNTER — Ambulatory Visit: Payer: PRIVATE HEALTH INSURANCE | Admitting: Family Medicine

## 2015-03-21 NOTE — Telephone Encounter (Signed)
He missed these days from work this year due to his migraines: Seems like he averages about 3-5 days per month.  1/11 1/27 1/28 3/1 3/16 3/17 3/18 4/5 4/25 4/26 5/3 back injury at work thru 5/11 5/23 6/8 8/12 6/9 6/10 6/29 7/6 7/18 7/19 7/20

## 2015-03-21 NOTE — Telephone Encounter (Signed)
Noted  

## 2015-03-21 NOTE — Telephone Encounter (Signed)
Spoke with pt, advised message from Dr. Tamala Julian. Pt will come in tomorrow at 5:30 to see Dr. Tamala Julian tomorrow. I spoke with pt for ten minutes explaining the FMLA process and the reason he would need to come in for office visit. FYI Dr. Tamala Julian.

## 2015-03-26 ENCOUNTER — Encounter: Payer: Self-pay | Admitting: Family Medicine

## 2015-03-26 ENCOUNTER — Ambulatory Visit (INDEPENDENT_AMBULATORY_CARE_PROVIDER_SITE_OTHER): Payer: PRIVATE HEALTH INSURANCE | Admitting: Family Medicine

## 2015-03-26 VITALS — BP 130/82 | HR 61 | Temp 97.5°F | Resp 16 | Ht 69.0 in | Wt 205.8 lb

## 2015-03-26 DIAGNOSIS — E7800 Pure hypercholesterolemia, unspecified: Secondary | ICD-10-CM

## 2015-03-26 DIAGNOSIS — G43809 Other migraine, not intractable, without status migrainosus: Secondary | ICD-10-CM

## 2015-03-26 DIAGNOSIS — R51 Headache: Secondary | ICD-10-CM | POA: Diagnosis not present

## 2015-03-26 DIAGNOSIS — R5383 Other fatigue: Secondary | ICD-10-CM | POA: Diagnosis not present

## 2015-03-26 DIAGNOSIS — F411 Generalized anxiety disorder: Secondary | ICD-10-CM | POA: Diagnosis not present

## 2015-03-26 DIAGNOSIS — R319 Hematuria, unspecified: Secondary | ICD-10-CM

## 2015-03-26 DIAGNOSIS — M5136 Other intervertebral disc degeneration, lumbar region: Secondary | ICD-10-CM | POA: Diagnosis not present

## 2015-03-26 DIAGNOSIS — G4700 Insomnia, unspecified: Secondary | ICD-10-CM | POA: Diagnosis not present

## 2015-03-26 DIAGNOSIS — R519 Headache, unspecified: Secondary | ICD-10-CM

## 2015-03-26 LAB — POCT URINALYSIS DIPSTICK
BILIRUBIN UA: NEGATIVE
GLUCOSE UA: NEGATIVE
Ketones, UA: NEGATIVE
Leukocytes, UA: NEGATIVE
Nitrite, UA: NEGATIVE
Protein, UA: NEGATIVE
RBC UA: NEGATIVE
SPEC GRAV UA: 1.025
Urobilinogen, UA: 0.2
pH, UA: 5.5

## 2015-03-26 LAB — COMPREHENSIVE METABOLIC PANEL
ALK PHOS: 53 U/L (ref 40–115)
ALT: 43 U/L (ref 9–46)
AST: 27 U/L (ref 10–35)
Albumin: 4.3 g/dL (ref 3.6–5.1)
BILIRUBIN TOTAL: 0.7 mg/dL (ref 0.2–1.2)
BUN: 21 mg/dL (ref 7–25)
CALCIUM: 8.8 mg/dL (ref 8.6–10.3)
CO2: 26 mmol/L (ref 20–31)
CREATININE: 1.07 mg/dL (ref 0.70–1.33)
Chloride: 104 mmol/L (ref 98–110)
Glucose, Bld: 123 mg/dL — ABNORMAL HIGH (ref 65–99)
Potassium: 4.3 mmol/L (ref 3.5–5.3)
SODIUM: 139 mmol/L (ref 135–146)
Total Protein: 6.9 g/dL (ref 6.1–8.1)

## 2015-03-26 LAB — POCT UA - MICROSCOPIC ONLY
Bacteria, U Microscopic: NEGATIVE
CRYSTALS, UR, HPF, POC: NEGATIVE
Casts, Ur, LPF, POC: NEGATIVE
Epithelial cells, urine per micros: NEGATIVE
Mucus, UA: NEGATIVE
RBC, URINE, MICROSCOPIC: NEGATIVE
WBC, Ur, HPF, POC: NEGATIVE
Yeast, UA: NEGATIVE

## 2015-03-26 LAB — LIPID PANEL
CHOL/HDL RATIO: 4.2 ratio (ref <=5.0)
CHOLESTEROL: 190 mg/dL (ref 125–200)
HDL: 45 mg/dL (ref >=40)
LDL Cholesterol: 73 mg/dL (ref <130)
TRIGLYCERIDES: 362 mg/dL — AB (ref <150)
VLDL: 72 mg/dL — AB (ref <30)

## 2015-03-26 MED ORDER — AMITRIPTYLINE HCL 10 MG PO TABS: 10.0000 mg | ORAL_TABLET | Freq: Every day | ORAL | Status: DC

## 2015-03-26 MED ORDER — DULOXETINE HCL 30 MG PO CPEP: 30.0000 mg | ORAL_CAPSULE | Freq: Every day | ORAL | Status: DC

## 2015-03-26 MED ORDER — ALPRAZOLAM 0.5 MG PO TABS: 0.5000 mg | ORAL_TABLET | Freq: Every evening | ORAL | Status: DC | PRN

## 2015-03-26 MED ORDER — RIZATRIPTAN BENZOATE 10 MG PO TBDP: 10.0000 mg | ORAL_TABLET | ORAL | Status: DC | PRN

## 2015-03-26 NOTE — Patient Instructions (Signed)
1. Start Amitryptiline 10mg  1-2 at bedtime for migraine prevention and insomnia. 2.  Start Cymbalta 30mg  one daily for mood. 3.  Decrease Xanax to 0.5mg  at bedtime for 2-4 weeks and then stop. 4. Decrease Lexapro 20mg  to 1/2 tablet daily for one month. 5. Use Maxalt for migraines.

## 2015-03-26 NOTE — Progress Notes (Signed)
Subjective:    Patient ID: John Preston, male    DOB: 03-11-1956, 59 y.o.   MRN: [id number]  03/26/2015  Follow-up; Anxiety; elevated cholesterol; Headache; Medication Refill; and would like to discuss FMLA   HPI This 59 y.o. male presents for six month follow-up:  1. Hyperlipidemia: Patient reports good compliance with medication, good tolerance to medication, and good symptom control.  Denies CP/palp/SOB/leg swelling.  Denies dizziness/focal weakness/paresthesias.   2. Anxiety: Patient reports good compliance with medication, good tolerance to medication, and good symptom control.  Several years ago, took Prozac but made very anxious.  Prozac caused insomnia as well.  Has been maintained on Lexapro for several years; has taken Xanax 1mg  qhs for years for insomnia; continues to drink wine or beer at night.  Admits to major stressors with family and now with work due to excessive sick days for headaches.      3. Insomnia:  Taking Xanax 1mg  qhs; taking almost every night; might skip it some.  Rarely misses Xanax.  Did not decrease Xanax dose at the recommendation of Dr. Brett Fairy in 09/2014.    4. OSA on CPAP:  Last follow-up with [facility] 09/27/2014; recommended decreasing alcohol consumption to one drink per night; reserve Xanax for PRN use only.  Follow-up in one year recommended. No recurrent migraines per neurology note since starting CPAP.  No FMLA approved from neurology.  Continues to suffer with fatigue even when does not have a headache.  Wearing CPAP all the time.  Must use CPAP so many hours per night; averages 7-8 hours per night.   Wife thinks that depression is causing fatigue.  Feels dead tired.    5. Hematuria: present at CPE; due for repeat u/a today.  6 .Headaches:  Really struggling with headaches and fatigue. Headaches usually last one day.  Usually takes the following day off also because of extreme fatigue following a headache.  Three episodes of headaches missed three days  each time.  Usually wakes up with headaches; headache located behind L eye and then spreads to entire head.  Will take Imitrex 100mg ; might take the edge off; sometimes will take second Imitrex; almost every time will need the second Imitrex; locks self in dark quiet room and waits it out.  +photophobia.  Bought black out curtains due to photophobia.  Mild phonophobia.  Saw Dr. Marin Comment 03/2014 for new onset migraines.  No nausea usually; can have nausea intermittently.   Only using Imitrex for migraines.  No blurred vision.  No dizziness.  Too severe to sleep with headache.  Chelsea has started cutting self again; had to have staples in R calf.  Awoke in middle of knife.  Unable to stop the bleeding; 11 staples; daughter is still living with patient; daughter is unpredictable.  Daughter is bipolar.  Major form is stress.  Worried about losing job due to missed work.  Work has been contacting pt every two weeks.  Needs FMLA; does not want to lose pt as employee.  Must have FMLA due to policies.  Last headache 03/16/15.   Here is a detailed phone message regarding pt's abscences from work:     Expand All Collapse All   He missed these days from work this year due to his migraines: Seems like he averages about 3-5 days per month.  1/11 1/27 1/28 3/1 3/16 3/17 3/18 4/5 4/25 4/26 5/3 back injury at work thru 5/11 5/23 6/8 8/12 6/9 6/10 6/29 7/6 7/18 7/19 7/20  Review of Systems  Constitutional: Negative for fever, chills, diaphoresis, activity change, appetite change and fatigue.  Respiratory: Negative for cough and shortness of breath.   Cardiovascular: Negative for chest pain, palpitations and leg swelling.  Gastrointestinal: Negative for nausea, vomiting, abdominal pain and diarrhea.  Endocrine: Negative for cold intolerance, heat intolerance, polydipsia, polyphagia and polyuria.  Skin: Negative for color change, rash and wound.  Neurological: Positive for headaches. Negative for  dizziness, tremors, seizures, syncope, facial asymmetry, speech difficulty, weakness, light-headedness and numbness.  Psychiatric/Behavioral: Positive for sleep disturbance. Negative for dysphoric mood. The patient is not nervous/anxious.     Past Medical History  Diagnosis Date  . Pain in joint, shoulder region   . Other specified viral warts   . Pure hypercholesterolemia   . Genital herpes, unspecified   . Other specified inflammatory disease of prostate     outlet obstruction with BPH.Urology consult 2006 [facility]  . Unspecified hearing loss   . Insomnia, unspecified   . Encounter for therapeutic drug monitoring   . Lumbago   . Internal hemorrhoids without mention of complication   . Anxiety state, unspecified   . Unspecified vitamin D deficiency   . Basal cell carcinoma of skin, site unspecified     L nasal; [facility].  . Genital herpes   . Depression   . Arthritis   . Internal hemorrhoids     colonoscopy 08/04/2008.  [facility].  . 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 Sleep related headaches 06/22/2014  . Sleep apnea   . Sleep apnea   . OSA on CPAP 09/27/2014   Past Surgical History  Procedure Laterality Date  . Knee surgery  1991  . Prostate surgery  2010    for BPH  . L. facial basal cell carcinoma resection  11/2010  . Colonoscopy  08/04/2008    internal hemorrhoids; repeat in 10 years.  [facility].  . Vasectomy     No Known Allergies  Social History   Social History  . Marital Status: Married    Spouse Name: N/A  . Number of Children: 2  . Years of Education: N/A   Occupational History  . Sales    Social History Main Topics  . Smoking status: Never Smoker   . Smokeless tobacco: Never Used  . Alcohol Use: 9.0 oz/week    15 Glasses of wine per week     Comment: moderate 1-2 glasses of wine per day; more on weekends.  . Drug Use: No     Comment: prevoiusly used marijuana  . Sexual Activity: Yes   Other Topics Concern  . Not on file   Social History  Narrative   Marital status:  Married x 32 years, happily.      Children: two adult children (Chelsea and Theresia Lo); no grandchildren.      Lives: with wife.      Tobacco:  None       Alcohol:   2 glasses of wine per night.  Usually 3 on weekends.  Some beer.      Drugs: none      Exercise: light; job very physically demanding.       Seatbelts:  Always uses seat belts.       Home safety:  Smoke alarm in the home. Carbon monoxide detector in the home.       Guns:  Guns in the home not stored in locked cabinet.       Caffeine use: 2 servings Coffee per day.   Family  History  Problem Relation Age of Onset  . COPD Mother   . Diabetes Mother   . Heart disease Mother 16    cardiac stenting/CAD; no AMI  . Cancer Maternal Grandmother   . Diabetes Maternal Grandmother   . Lung disease      Oxygen dependent  . Tics Father   . Dementia Father   . Mental illness Father        Objective:    BP 130/82 mmHg  Pulse 61  Temp(Src) 97.5 F (36.4 C) (Oral)  Resp 16  Ht 5\' 9"  (1.753 m)  Wt 205 lb 12.8 oz (93.35 kg)  BMI 30.38 kg/m2  SpO2 100% Physical Exam  Constitutional: He is oriented to person, place, and time. He appears well-developed and well-nourished. No distress.  HENT:  Head: Normocephalic and atraumatic.  Right Ear: External ear normal.  Left Ear: External ear normal.  Nose: Nose normal.  Mouth/Throat: Oropharynx is clear and moist.  Eyes: Conjunctivae and EOM are normal. Pupils are equal, round, and reactive to light.  Neck: Normal range of motion. Neck supple. Carotid bruit is not present. No thyromegaly present.  Cardiovascular: Normal rate, regular rhythm, normal heart sounds and intact distal pulses.  Exam reveals no gallop and no friction rub.   No murmur heard. Pulmonary/Chest: Effort normal and breath sounds normal. He has no wheezes. He has no rales.  Abdominal: Soft. Bowel sounds are normal. He exhibits no distension and no mass. There is no tenderness. There is no  rebound and no guarding.  Lymphadenopathy:    He has no cervical adenopathy.  Neurological: He is alert and oriented to person, place, and time. He displays normal reflexes. No cranial nerve deficit. He exhibits normal muscle tone. Coordination normal.  Skin: Skin is warm and dry. No rash noted. He is not diaphoretic.  Psychiatric: He has a normal mood and affect. His behavior is normal. Judgment and thought content normal.  Nursing note and vitals reviewed.  Results for orders placed or performed in visit on 03/26/15  Comprehensive metabolic panel  Result Value Ref Range   Sodium 139 135 - 146 mmol/L   Potassium 4.3 3.5 - 5.3 mmol/L   Chloride 104 98 - 110 mmol/L   CO2 26 20 - 31 mmol/L   Glucose, Bld 123 (H) 65 - 99 mg/dL   BUN 21 7 - 25 mg/dL   Creat 1.07 0.70 - 1.33 mg/dL   Total Bilirubin 0.7 0.2 - 1.2 mg/dL   Alkaline Phosphatase 53 40 - 115 U/L   AST 27 10 - 35 U/L   ALT 43 9 - 46 U/L   Total Protein 6.9 6.1 - 8.1 g/dL   Albumin 4.3 3.6 - 5.1 g/dL   Calcium 8.8 8.6 - 10.3 mg/dL  Lipid panel  Result Value Ref Range   Cholesterol 190 125 - 200 mg/dL   Triglycerides 362 (H) <150 mg/dL   HDL 45 >=40 mg/dL   Total CHOL/HDL Ratio 4.2 <=5.0 Ratio   VLDL 72 (H) <30 mg/dL   LDL Cholesterol 73 <130 mg/dL  Testosterone  Result Value Ref Range   Testosterone 350 300 - 890 ng/dL  POCT urinalysis dipstick  Result Value Ref Range   Color, UA yellow    Clarity, UA clear    Glucose, UA neg    Bilirubin, UA neg    Ketones, UA neg    Spec Grav, UA 1.025    Blood, UA neg    pH, UA 5.5  Protein, UA neg    Urobilinogen, UA 0.2    Nitrite, UA neg    Leukocytes, UA Negative Negative  POCT UA - Microscopic Only  Result Value Ref Range   WBC, Ur, HPF, POC neg    RBC, urine, microscopic neg    Bacteria, U Microscopic neg    Mucus, UA neg    Epithelial cells, urine per micros neg    Crystals, Ur, HPF, POC neg    Casts, Ur, LPF, POC neg    Yeast, UA neg        Assessment &  Plan:   1. Pure hypercholesterolemia   2. Generalized anxiety disorder   3. DDD (degenerative disc disease), lumbar   4. Sleep related headaches   5. Insomnia   6. Hematuria   7. Other fatigue   8. Other migraine without status migrainosus, not intractable    1. Hypercholesterolemia: controlled; obtain labs; continue current medication. 2.  Generalized anxiety disorder: worsening due to family and work stressors; wean Lexapro over the upcoming month; start Cymbalta 30mg  daily with plan to increase to 60mg  daily if tolerating 30mg  for one month.  Decrease Xanax to 0.5mg  qhs. 3.  DDD lumbar: with recent worsening due to work injury; now improved. 4.  Sleep related headaches: improved with CPAP. 5.  Insomnia: controlled; decrease Xanax to 0.5mg  qhs; add Amitriptyline for migraine prevention; plan to discontinue Xanax in future and increase Amitriptyline. 6.  Hematuria microscopic:  New at CPE six months ago.  Resolved now. 7.  Hypersomnolence/fatigue: Persistent despite treatment with CPAP.  Refer back to Dr. Brett Fairy for further management.  8. Migraines: uncontrolled/worsening; wean Lexapro over next month and replace with Cymbalta 30mg  daily; add Amitriptyline qhs.  Imitrex not effective as abortive agent; rx for Maxalt provided.  Refer to neurology due to worsening migraines and extensive amount of work missed.  FMLA paperwork completed during visit.  Close follow-up in one month.   Meds ordered this encounter  Medications  . amitriptyline (ELAVIL) 10 MG tablet    Sig: Take 1-2 tablets (10-20 mg total) by mouth at bedtime.    Dispense:  60 tablet    Refill:  5  . DULoxetine (CYMBALTA) 30 MG capsule    Sig: Take 1 capsule (30 mg total) by mouth daily.    Dispense:  30 capsule    Refill:  1  . rizatriptan (MAXALT-MLT) 10 MG disintegrating tablet    Sig: Take 1 tablet (10 mg total) by mouth as needed for migraine. May repeat in 2 hours if needed    Dispense:  8 tablet    Refill:  5    . ALPRAZolam (XANAX) 0.5 MG tablet    Sig: Take 1 tablet (0.5 mg total) by mouth at bedtime as needed for anxiety.    Dispense:  30 tablet    Refill:  0    Return in about 4 weeks (around 04/23/2015) for recheck.     Teigan Sahli Elayne Guerin, M.D. [facility] [address] [place], [place]  [address] [phone] phone [phone] fax

## 2015-03-27 LAB — TESTOSTERONE: Testosterone: 350 ng/dL (ref 300–890)

## 2015-03-27 NOTE — Telephone Encounter (Signed)
FMLA paperwork completed on 03/26/15; copy given to patient and copy placed on disability desk at 102.

## 2015-03-29 ENCOUNTER — Ambulatory Visit: Payer: Self-pay | Admitting: Neurology

## 2015-03-29 ENCOUNTER — Telehealth: Payer: Self-pay

## 2015-03-29 NOTE — Telephone Encounter (Signed)
Pt got a call from [place] and he thinks it's  concerning lab results. Please advise at [phone]

## 2015-03-30 ENCOUNTER — Ambulatory Visit (INDEPENDENT_AMBULATORY_CARE_PROVIDER_SITE_OTHER): Payer: Worker's Compensation | Admitting: Family Medicine

## 2015-03-30 ENCOUNTER — Ambulatory Visit: Payer: Worker's Compensation

## 2015-03-30 VITALS — BP 130/92 | HR 85 | Temp 98.2°F | Resp 16 | Ht 69.25 in | Wt 207.0 lb

## 2015-03-30 DIAGNOSIS — M25521 Pain in right elbow: Secondary | ICD-10-CM | POA: Diagnosis not present

## 2015-03-30 DIAGNOSIS — M7711 Lateral epicondylitis, right elbow: Secondary | ICD-10-CM | POA: Diagnosis not present

## 2015-03-30 NOTE — Patient Instructions (Addendum)
Please purchase a counter force brace.  Ice the area of the elbow 3-4 times per day for 15 minutes. Please choose three of these exercises and follow the directions below.  This is a slow healing injury, and will require you attention to exercise, and keeping the forced brace on the elbow.  Lateral Epicondylitis (Tennis Elbow) with Rehab Lateral epicondylitis involves inflammation and pain around the outer portion of the elbow. The pain is caused by inflammation of the tendons in the forearm that bring back (extend) the wrist. Lateral epicondylitis is also called tennis elbow, because it is very common in tennis players. However, it may occur in any individual who extends the wrist repetitively. If lateral epicondylitis is left untreated, it may become a chronic problem. SYMPTOMS   Pain, tenderness, and inflammation on the outer (lateral) side of the elbow.  Pain or weakness with gripping activities.  Pain that increases with wrist-twisting motions (playing tennis, using a screwdriver, opening a door or a jar).  Pain with lifting objects, including a coffee cup. CAUSES  Lateral epicondylitis is caused by inflammation of the tendons that extend the wrist. Causes of injury may include:  Repetitive stress and strain on the muscles and tendons that extend the wrist.  Sudden change in activity level or intensity.  Incorrect grip in racquet sports.  Incorrect grip size of racquet (often too large).  Incorrect hitting position or technique (usually backhand, leading with the elbow).  Using a racket that is too heavy. RISK INCREASES WITH:  Sports or occupations that require repetitive and/or strenuous forearm and wrist movements (tennis, squash, racquetball, carpentry).  Poor wrist and forearm strength and flexibility.  Failure to warm up properly before activity.  Resuming activity before healing, rehabilitation, and conditioning are complete. PREVENTION   Warm up and stretch  properly before activity.  Maintain physical fitness:  Strength, flexibility, and endurance.  Cardiovascular fitness.  Wear and use properly fitted equipment.  Learn and use proper technique and have a coach correct improper technique.  Wear a tennis elbow (counterforce) brace. PROGNOSIS  The course of this condition depends on the degree of the injury. If treated properly, acute cases (symptoms lasting less than 4 weeks) are often resolved in 2 to 6 weeks. Chronic (longer lasting cases) often resolve in 3 to 6 months but may require physical therapy. RELATED COMPLICATIONS   Frequently recurring symptoms, resulting in a chronic problem. Properly treating the problem the first time decreases frequency of recurrence.  Chronic inflammation, scarring tendon degeneration, and partial tendon tear, requiring surgery.  Delayed healing or resolution of symptoms. TREATMENT  Treatment first involves the use of ice and medicine to reduce pain and inflammation. Strengthening and stretching exercises may help reduce discomfort if performed regularly. These exercises may be performed at home if the condition is an acute injury. Chronic cases may require a referral to a physical therapist for evaluation and treatment. Your caregiver may advise a corticosteroid injection to help reduce inflammation. Rarely, surgery is needed. MEDICATION  If pain medicine is needed, nonsteroidal anti-inflammatory medicines (aspirin and ibuprofen), or other minor pain relievers (acetaminophen), are often advised.  Do not take pain medicine for 7 days before surgery.  Prescription pain relievers may be given, if your caregiver thinks they are needed. Use only as directed and only as much as you need.  Corticosteroid injections may be recommended. These injections should be reserved only for the most severe cases, because they can only be given a certain number of times. HEAT  AND COLD  Cold treatment (icing) should be  applied for 10 to 15 minutes every 2 to 3 hours for inflammation and pain, and immediately after activity that aggravates your symptoms. Use ice packs or an ice massage.  Heat treatment may be used before performing stretching and strengthening activities prescribed by your caregiver, physical therapist, or athletic trainer. Use a heat pack or a warm water soak. SEEK MEDICAL CARE IF: Symptoms get worse or do not improve in 2 weeks, despite treatment. EXERCISES  RANGE OF MOTION (ROM) AND STRETCHING EXERCISES - Epicondylitis, Lateral (Tennis Elbow) These exercises may help you when beginning to rehabilitate your injury. Your symptoms may go away with or without further involvement from your physician, physical therapist, or athletic trainer. While completing these exercises, remember:   Restoring tissue flexibility helps normal motion to return to the joints. This allows healthier, less painful movement and activity.  An effective stretch should be held for at least 30 seconds.  A stretch should never be painful. You should only feel a gentle lengthening or release in the stretched tissue. RANGE OF MOTION - Wrist Flexion, Active-Assisted  Extend your right / left elbow with your fingers pointing down.*  Gently pull the back of your hand towards you, until you feel a gentle stretch on the top of your forearm.  Hold this position for __________ seconds. Repeat __________ times. Complete this exercise __________ times per day.  *If directed by your physician, physical therapist or athletic trainer, complete this stretch with your elbow bent, rather than extended. RANGE OF MOTION - Wrist Extension, Active-Assisted  Extend your right / left elbow and turn your palm upwards.*  Gently pull your palm and fingertips back, so your wrist extends and your fingers point more toward the ground.  You should feel a gentle stretch on the inside of your forearm.  Hold this position for __________  seconds. Repeat __________ times. Complete this exercise __________ times per day. *If directed by your physician, physical therapist or athletic trainer, complete this stretch with your elbow bent, rather than extended. STRETCH - Wrist Flexion  Place the back of your right / left hand on a tabletop, leaving your elbow slightly bent. Your fingers should point away from your body.  Gently press the back of your hand down onto the table by straightening your elbow. You should feel a stretch on the top of your forearm.  Hold this position for __________ seconds. Repeat __________ times. Complete this stretch __________ times per day.  STRETCH - Wrist Extension   Place your right / left fingertips on a tabletop, leaving your elbow slightly bent. Your fingers should point backwards.  Gently press your fingers and palm down onto the table by straightening your elbow. You should feel a stretch on the inside of your forearm.  Hold this position for __________ seconds. Repeat __________ times. Complete this stretch __________ times per day.  STRENGTHENING EXERCISES - Epicondylitis, Lateral (Tennis Elbow) These exercises may help you when beginning to rehabilitate your injury. They may resolve your symptoms with or without further involvement from your physician, physical therapist, or athletic trainer. While completing these exercises, remember:   Muscles can gain both the endurance and the strength needed for everyday activities through controlled exercises.  Complete these exercises as instructed by your physician, physical therapist or athletic trainer. Increase the resistance and repetitions only as guided.  You may experience muscle soreness or fatigue, but the pain or discomfort you are trying to eliminate should never  worsen during these exercises. If this pain does get worse, stop and make sure you are following the directions exactly. If the pain is still present after adjustments,  discontinue the exercise until you can discuss the trouble with your caregiver. STRENGTH - Wrist Flexors  Sit with your right / left forearm palm-up and fully supported on a table or countertop. Your elbow should be resting below the height of your shoulder. Allow your wrist to extend over the edge of the surface.  Loosely holding a __________ weight, or a piece of rubber exercise band or tubing, slowly curl your hand up toward your forearm.  Hold this position for __________ seconds. Slowly lower the wrist back to the starting position in a controlled manner. Repeat __________ times. Complete this exercise __________ times per day.  STRENGTH - Wrist Extensors  Sit with your right / left forearm palm-down and fully supported on a table or countertop. Your elbow should be resting below the height of your shoulder. Allow your wrist to extend over the edge of the surface.  Loosely holding a __________ weight, or a piece of rubber exercise band or tubing, slowly curl your hand up toward your forearm.  Hold this position for __________ seconds. Slowly lower the wrist back to the starting position in a controlled manner. Repeat __________ times. Complete this exercise __________ times per day.  STRENGTH - Ulnar Deviators  Stand with a ____________________ weight in your right / left hand, or sit while holding a rubber exercise band or tubing, with your healthy arm supported on a table or countertop.  Move your wrist, so that your pinkie travels toward your forearm and your thumb moves away from your forearm.  Hold this position for __________ seconds and then slowly lower the wrist back to the starting position. Repeat __________ times. Complete this exercise __________ times per day STRENGTH - Radial Deviators  Stand with a ____________________ weight in your right / left hand, or sit while holding a rubber exercise band or tubing, with your injured arm supported on a table or  countertop.  Raise your hand upward in front of you or pull up on the rubber tubing.  Hold this position for __________ seconds and then slowly lower the wrist back to the starting position. Repeat __________ times. Complete this exercise __________ times per day. STRENGTH - Forearm Supinators   Sit with your right / left forearm supported on a table, keeping your elbow below shoulder height. Rest your hand over the edge, palm down.  Gently grip a hammer or a soup ladle.  Without moving your elbow, slowly turn your palm and hand upward to a "thumbs-up" position.  Hold this position for __________ seconds. Slowly return to the starting position. Repeat __________ times. Complete this exercise __________ times per day.  STRENGTH - Forearm Pronators   Sit with your right / left forearm supported on a table, keeping your elbow below shoulder height. Rest your hand over the edge, palm up.  Gently grip a hammer or a soup ladle.  Without moving your elbow, slowly turn your palm and hand upward to a "thumbs-up" position.  Hold this position for __________ seconds. Slowly return to the starting position. Repeat __________ times. Complete this exercise __________ times per day.  STRENGTH - Grip  Grasp a tennis ball, a dense sponge, or a large, rolled sock in your hand.  Squeeze as hard as you can, without increasing any pain.  Hold this position for __________ seconds. Release your grip  slowly. Repeat __________ times. Complete this exercise __________ times per day.  STRENGTH - Elbow Extensors, Isometric  Stand or sit upright, on a firm surface. Place your right / left arm so that your palm faces your stomach, and it is at the height of your waist.  Place your opposite hand on the underside of your forearm. Gently push up as your right / left arm resists. Push as hard as you can with both arms, without causing any pain or movement at your right / left elbow. Hold this stationary position  for __________ seconds. Gradually release the tension in both arms. Allow your muscles to relax completely before repeating. Document Released: 07/21/2005 Document Revised: 12/05/2013 Document Reviewed: 11/02/2008 [facility] Patient Information 2015 [place], [place]. This information is not intended to replace advice given to you by your health care provider. Make sure you discuss any questions you have with your health care provider.

## 2015-03-30 NOTE — Progress Notes (Deleted)
[facility] [address] [place] [address] [phone]  Date:  03/30/2015   Name:  John Preston   DOB:  Mar 12, 1956   MRN:  [id number]  PCP:  [name], MD    History of Present Illness:   John Preston is a 59 y.o. male patient who presents to [facility] for chief complaint of right elbow pain.       Patient Active Problem List   Diagnosis Date Noted  . OSA on CPAP 09/27/2014  . Snoring 07/13/2014  . 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 Sleep related headaches 06/22/2014  . Insomnia 03/06/2014  . Pure hypercholesterolemia 01/20/2013  . Other abnormal glucose 01/20/2013  . Generalized anxiety disorder 01/20/2013  . DDD (degenerative disc disease), lumbar 01/20/2013    Past Medical History  Diagnosis Date  . Pain in joint, shoulder region   . Other specified viral warts   . Pure hypercholesterolemia   . Genital herpes, unspecified   . Other specified inflammatory disease of prostate     outlet obstruction with BPH.Urology consult 2006 [facility]  . Unspecified hearing loss   . Insomnia, unspecified   . Encounter for therapeutic drug monitoring   . Lumbago   . Internal hemorrhoids without mention of complication   . Anxiety state, unspecified   . Unspecified vitamin D deficiency   . Basal cell carcinoma of skin, site unspecified     L nasal; [place].  . Genital herpes   . Depression   . Arthritis   . Internal hemorrhoids     colonoscopy 08/04/2008.  [facility].  . 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 Sleep related headaches 06/22/2014  . Sleep apnea   . Sleep apnea   . OSA on CPAP 09/27/2014    Past Surgical History  Procedure Laterality Date  . Knee surgery  1991  . Prostate surgery  2010    for BPH  . L. facial basal cell carcinoma resection  11/2010  . Colonoscopy  08/04/2008    internal hemorrhoids; repeat in 10 years.  [facility].  . Vasectomy      Social History  Substance Use Topics  . Smoking status: Never Smoker   .  Smokeless tobacco: Never Used  . Alcohol Use: 9.0 oz/week    15 Glasses of wine per week     Comment: moderate 1-2 glasses of wine per day; more on weekends.    Family History  Problem Relation Age of Onset  . COPD Mother   . Diabetes Mother   . Heart disease Mother 33    cardiac stenting/CAD; no AMI  . Cancer Maternal Grandmother   . Diabetes Maternal Grandmother   . Lung disease      Oxygen dependent  . Tics Father   . Dementia Father   . Mental illness Father     No Known Allergies  Medication list has been reviewed and updated.  Current Outpatient Prescriptions on File Prior to Visit  Medication Sig Dispense Refill  . acyclovir (ZOVIRAX) 800 MG tablet Take 1 tablet (800 mg total) by mouth 3 (three) times daily. 30 tablet 4  . ALPRAZolam (XANAX) 0.5 MG tablet Take 1 tablet (0.5 mg total) by mouth at bedtime as needed for anxiety. 30 tablet 0  . amitriptyline (ELAVIL) 10 MG tablet Take 1-2 tablets (10-20 mg total) by mouth at bedtime. 60 tablet 5  . DULoxetine (CYMBALTA) 30 MG capsule Take 1 capsule (30 mg total) by mouth daily. 30 capsule 1  . escitalopram (  LEXAPRO) 20 MG tablet TAKE 1 TABLET BY MOUTH DAILY 30 tablet 5  . indomethacin (INDOCIN) 25 MG capsule Take 2 capsules (50 mg total) by mouth 2 (two) times daily with a meal. 60 capsule 1  . Multiple Vitamin (MULTIVITAMIN) tablet Take 1 tablet by mouth daily.    . rizatriptan (MAXALT-MLT) 10 MG disintegrating tablet Take 1 tablet (10 mg total) by mouth as needed for migraine. May repeat in 2 hours if needed 8 tablet 5  . simvastatin (ZOCOR) 80 MG tablet Take 0.5 tablets (40 mg total) by mouth at bedtime. 30 tablet 5  . SUMAtriptan (IMITREX) 50 MG tablet Take 1 tablet (50 mg total) by mouth 4 (four) times daily as needed for migraine or headache. May repeat in 2 hours after 1st dose  if headache persists or recurs. Not to exceed 300 mg in 24 hours 10 tablet 5   No current facility-administered medications on file prior to  visit.    ROS   Physical Examination: BP 130/92 mmHg  Pulse 85  Temp(Src) 98.2 F (36.8 C) (Oral)  Resp 16  Ht 5' 9.25" (1.759 m)  Wt 207 lb (93.895 kg)  BMI 30.35 kg/m2  SpO2 99% Ideal Body Weight: Weight in (lb) to have BMI = 25: 170.2  Physical Exam   Assessment and Plan:  There are no diagnoses linked to this encounter.  Ivar Drape, PA-C [facility] 03/30/2015 2:21 PM

## 2015-03-30 NOTE — Progress Notes (Signed)
Will come into walk in clinic on Sept 21st to see Dr Tamala Julian

## 2015-03-30 NOTE — Progress Notes (Signed)
John Preston June 13, 1956 59 y.o.   Chief Complaint  Patient presents with  . Elbow Injury    right elbow injury at work x 2 weeks ago      Date of Injury: 03/19/2015  History of Present Illness:  Presents for evaluation of work-related complaint.  Patient is here today with chief complaint of right elbow pain for 2 weeks.  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  This was hurting throughout the day and seemed to be resolving.  Pain at that lateral elbow localized to that area.  But for the last week he has pain with extending his arm and lifting at the lateral elbow.  He has no numbness or tingling.  He has some warmth over left elbow.     ROS ROS otherwise unremarkable unless listed above.     No Known Allergies   Current medications reviewed and updated. Past medical history, family history, social history have been reviewed and updated.   Physical Exam  Constitutional: He is oriented to person, place, and time and well-developed, well-nourished, and in no distress. No distress.  Cardiovascular: Normal rate.   Pulmonary/Chest: Effort normal. No respiratory distress. He has no wheezes.  Musculoskeletal:       Right elbow: He exhibits normal range of motion, no swelling and no effusion. Tenderness found. Lateral epicondyle tenderness noted. No radial head, no medial epicondyle and no olecranon process tenderness noted.  Pain with resisted wrist extension and resisted pronation.    Neurological: He is alert and oriented to person, place, and time.  Skin: Skin is warm and dry. He is not diaphoretic.  Psychiatric: Affect and judgment normal.    [facility] reading (PRIMARY) by  Dr. Lorelei Pont: Normal  Assessment and Plan: 59 year old male is here today for chief complaint of right elbow pain for 2 weeks.   -PE and hx consistent with lateral epicondylitis.  I have advised counter forced brace to wear at all  times at work.   -Indomethacin 25-50mg  tid as needed -Ice 3-4 times per day -stretches given both verbally and in handout.   -Restrictions given via letter (nothing over 25lbs with right arm, overhead lifting). -follow up with Dr. Tamala Julian 04/25/2015 with separate wc visit.

## 2015-04-25 ENCOUNTER — Ambulatory Visit (INDEPENDENT_AMBULATORY_CARE_PROVIDER_SITE_OTHER): Payer: PRIVATE HEALTH INSURANCE | Admitting: Family Medicine

## 2015-04-25 ENCOUNTER — Ambulatory Visit (INDEPENDENT_AMBULATORY_CARE_PROVIDER_SITE_OTHER): Payer: Worker's Compensation | Admitting: Family Medicine

## 2015-04-25 VITALS — BP 122/80 | HR 89 | Temp 98.0°F | Resp 18 | Ht 69.0 in | Wt 210.4 lb

## 2015-04-25 DIAGNOSIS — Z23 Encounter for immunization: Secondary | ICD-10-CM

## 2015-04-25 DIAGNOSIS — G478 Other sleep disorders: Secondary | ICD-10-CM

## 2015-04-25 DIAGNOSIS — G43709 Chronic migraine without aura, not intractable, without status migrainosus: Secondary | ICD-10-CM | POA: Diagnosis not present

## 2015-04-25 DIAGNOSIS — M7711 Lateral epicondylitis, right elbow: Secondary | ICD-10-CM

## 2015-04-25 DIAGNOSIS — F411 Generalized anxiety disorder: Secondary | ICD-10-CM

## 2015-04-25 DIAGNOSIS — G4700 Insomnia, unspecified: Secondary | ICD-10-CM | POA: Diagnosis not present

## 2015-04-25 DIAGNOSIS — G4733 Obstructive sleep apnea (adult) (pediatric): Secondary | ICD-10-CM

## 2015-04-25 DIAGNOSIS — Y990 Civilian activity done for income or pay: Secondary | ICD-10-CM | POA: Diagnosis not present

## 2015-04-25 DIAGNOSIS — Z9989 Dependence on other enabling machines and devices: Secondary | ICD-10-CM

## 2015-04-25 NOTE — Patient Instructions (Signed)
1. Increase Elavil to 2 tablets at bedtime. 2. Decrease Xanax 0.5mg  to 1/2 tablet at bedtime. 3.  Continue Cymbalta 30mg  one daily. 4. Continue Lexapro 20mg  1/2 daily.

## 2015-04-25 NOTE — Progress Notes (Signed)
Subjective:  This chart was scribed for [name], MD by Thea Alken, ED Scribe. This patient was seen in room 9 and the patient's care was started at 6:17 PM.   Patient ID: John Preston, male    DOB: Jul 18, 1956, 59 y.o.   MRN: [id number]  HPI   Chief Complaint  Patient presents with  . Follow-up    migraines  . Follow-up    medication follow-up   HPI Comments: John Preston is a 59 y.o. male who presents to the [facility] for a 1 month follow.     Migraines: He was seen for worsening migraines 1 month ago. I weaned him off lexapro and started cymbalta 30 mg daily for anxiety and migraine prevention. Also prescribed Maxalt for migrines, imitrex was ineffective in past and referred  Neurology.  Migraines are much improved since last visit; has not suffered with a severe migraine since last visit.   Since last visit with me pt has not had a HA. Has a slight HA at this time, but states today has been extremely busy. For the past month he has been taking  1 Cymbalta 30 mg with a half of lexapro 20 mg. He has about another months worth of Lexapro left. Only took Maxalt once since last visit with relief to migraine. Prior to last month (see previous office visit) patient was missing multiple days of work per month for the past six to nine months.    Stressors/anxiety: Recently called out of work to put down his 78 y.o. Dog, and was suspended for 4 days without pay the following day for taking off. Also states his mother has been ill and is in hospice care, he believes she is going to pass within the next month. Got to see her while suspended from work and states that was possibly the last time he will see her alive. 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 Denies thoughts of hurting himself. Tolerating Cymbalta without side effects; weaning Lexapro.   Insomnia:  Added Amitriptyline at bed time and decreased Xanax to a half a tablet at last visit. He has been  having vivid dreams at night. He believes this is due to amitriptyline.  Takes 1 of Amitriptyline and half a xanax at bed time. Occasionally takes an extra dose of Amitriptyline if needed.  Very reluctant to wean off of Xanax; has been taking for years.  Hypersomnolence/fatigue: He was suffering from Hypersomnolence and fatigue and was told to follow up with Dr. Brett Fairy but has not made an appointment with her earlier than 09/2015 follow-up.  Compliance with CPAP machine. Weaning Lexapro.  Dry Skin: pt has also noticed small patches of dry skin with some flaking on face. Has tried A&D ointment.  Agrees to flu vaccine today.   Patient Active Problem List   Diagnosis Date Noted  . Migraine 05/28/2015  . OSA on CPAP 09/27/2014  . Snoring 07/13/2014  . 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 Sleep related headaches 06/22/2014  . Insomnia 03/06/2014  . Pure hypercholesterolemia 01/20/2013  . Other abnormal glucose 01/20/2013  . Generalized anxiety disorder 01/20/2013  . DDD (degenerative disc disease), lumbar 01/20/2013   Past Medical History  Diagnosis Date  . Pain in joint, shoulder region   . Other specified viral warts   . Pure hypercholesterolemia   . Genital herpes, unspecified   . 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 Unspecified hearing loss   . Insomnia, unspecified   . Encounter for therapeutic drug monitoring   . Lumbago   . Internal hemorrhoids without mention of complication   . Anxiety state, unspecified   . Unspecified vitamin D deficiency   . Basal cell carcinoma of skin, site unspecified     L nasal; [facility].  . Genital herpes   . Depression   . Arthritis   . Internal hemorrhoids     colonoscopy 08/04/2008.  [facility].  . 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 Sleep related headaches 06/22/2014  . Sleep apnea   . Sleep apnea   . OSA on CPAP 09/27/2014   Prior to Admission  medications   Medication Sig Start Date End Date Taking? Authorizing Tilman Mcclaren  acyclovir (ZOVIRAX) 800 MG tablet Take 1 tablet (800 mg total) by mouth 3 (three) times daily. 06/28/12  Yes Wardell Honour, MD  ALPRAZolam Duanne Moron) 0.5 MG tablet Take 1 tablet (0.5 mg total) by mouth at bedtime as needed for anxiety. 03/26/15  Yes Wardell Honour, MD  amitriptyline (ELAVIL) 10 MG tablet Take 1-2 tablets (10-20 mg total) by mouth at bedtime. 03/26/15  Yes Wardell Honour, MD  DULoxetine (CYMBALTA) 30 MG capsule Take 1 capsule (30 mg total) by mouth daily. 03/26/15  Yes Wardell Honour, MD  escitalopram (LEXAPRO) 20 MG tablet TAKE 1 TABLET BY MOUTH DAILY 09/20/14  Yes Wardell Honour, MD  indomethacin (INDOCIN) 25 MG capsule Take 2 capsules (50 mg total) by mouth 2 (two) times daily with a meal. 12/26/14  Yes Shawnee Knapp, MD  Multiple Vitamin (MULTIVITAMIN) tablet Take 1 tablet by mouth daily.   Yes Historical Safina Huard, MD  rizatriptan (MAXALT-MLT) 10 MG disintegrating tablet Take 1 tablet (10 mg total) by mouth as needed for migraine. May repeat in 2 hours if needed 03/26/15  Yes Wardell Honour, MD  simvastatin (ZOCOR) 80 MG tablet Take 0.5 tablets (40 mg total) by mouth at bedtime. 09/20/14  Yes Wardell Honour, MD  SUMAtriptan (IMITREX) 50 MG tablet Take 1 tablet (50 mg total) by mouth 4 (four) times daily as needed for migraine or headache. May repeat in 2 hours after 1st dose  if headache persists or recurs. Not to exceed 300 mg in 24 hours 03/10/14  Yes Thao P Le, DO   Review of Systems  Constitutional: Positive for fatigue. Negative for fever, chills, diaphoresis, activity change and appetite change.  Respiratory: Negative for cough and shortness of breath.   Cardiovascular: Negative for chest pain, palpitations and leg swelling.  Gastrointestinal: Negative for nausea, vomiting, abdominal pain, diarrhea and constipation.  Endocrine: Negative for cold intolerance, heat intolerance, polydipsia, polyphagia and  polyuria.  Skin: Negative for color change, rash and wound.  Neurological: Negative for dizziness, tremors, seizures, syncope, facial asymmetry, speech difficulty, weakness, light-headedness, numbness and headaches.  Psychiatric/Behavioral: Negative for suicidal ideas, sleep disturbance, self-injury and dysphoric mood. The patient is nervous/anxious.     Objective:   Physical Exam  Constitutional: He is oriented to person, place, and time. He appears well-developed and well-nourished. No distress.  HENT:  Head: Normocephalic and atraumatic.  Right Ear: External ear normal.  Left Ear: External ear normal.  Nose: Nose normal.  Mouth/Throat: Oropharynx is clear and moist.  Eyes: Conjunctivae and EOM are normal. Pupils are equal, round, and reactive to light.  Neck: Normal range of motion. Neck supple. Carotid bruit is not present. No thyromegaly present.  Cardiovascular: Normal rate,  regular rhythm, normal heart sounds and intact distal pulses.  Exam reveals no gallop and no friction rub.   No murmur heard. Pulmonary/Chest: Effort normal and breath sounds normal. He has no wheezes. He has no rales.  Abdominal: Soft. Bowel sounds are normal. He exhibits no distension and no mass. There is no tenderness. There is no rebound and no guarding.  Musculoskeletal: Normal range of motion.  Lymphadenopathy:    He has no cervical adenopathy.  Neurological: He is alert and oriented to person, place, and time. No cranial nerve deficit. He exhibits normal muscle tone. Coordination normal.  Skin: Skin is warm and dry. No rash noted. He is not diaphoretic.  Psychiatric: He has a normal mood and affect. His behavior is normal. Judgment and thought content normal.  Nursing note and vitals reviewed.  Filed Vitals:   04/25/15 1805  BP: 122/80  Pulse: 89  Temp: 98 F (36.7 C)  TempSrc: Oral  Resp: 18  Height: 5\' 9"  (1.753 m)  Weight: 210 lb 6 oz (95.425 kg)  SpO2: 98%     INFLUENZA VACCINE  ADMINISTERED.  Assessment & Plan:   1. Chronic migraine without aura without status migrainosus, not intractable   2. Insomnia   3. Generalized anxiety disorder   4. OSA on CPAP   5. Non-restorative sleep   6. Need for prophylactic vaccination and inoculation against influenza   No orders of the defined types were placed in this encounter.     1. Chronic migraines: improved with medication changes at last visit.  Increase Elavil to two tablets at bedtime; decrease Xanax 0.5mg  to 1/2 qhs.  Continue Cymbalta 30mg  daily; continue Lexapro 20mg  1/2 daily.  Will plan to wean Lexapro further at next visit and increase Cymbalta to 60mg  daily. 2.  Insomnia: controlled; increase Amitriptyline to 2 at bedtime. Decrease Xanax to 0.5mg  1/2 qhs with plan to discontinue Xanax at next visit. 3.  Generalized anxiety disorder: stable; major family and work stressors currently; etiology to worsening headaches.  Continue Cymbalta 30mg  daily and Lexapro 20mg  1/2 daily for now.  Will increase Cymbalta to 60mg  daily at next visit and discontinue Lexapro.  4.  OSA on CPAP: stable; compliance reported with CPAP yet suffering with persistent hypersomnolence; highly recommend follow-up with sleep medicine. 5.  Non-restorative sleep: persistent despite CPAP: question compliance; stressors and anxiety may be contributing. 6.  S/p flu vaccine.  Orders Placed This Encounter  Procedures  . Flu Vaccine QUAD 36+ mos IM     By signing my name below, I, Raven Small, attest that this documentation has been prepared under the direction and in the presence of [name], MD.  Electronically Signed: Thea Alken, ED Scribe. 04/25/2015. 12:58 PM.  I personally performed the services described in this documentation, which was scribed in my presence. The recorded information has been reviewed and considered. Kristi Elayne Guerin, M.D. [facility] [address] [place], [place]  [phone] phone [phone] fax

## 2015-04-25 NOTE — Progress Notes (Signed)
   Subjective:  This chart was scribed for John Forts, MD by Thea Alken, ED Scribe. This patient was seen in room 9 and the patient's care was started at [place] PM.   Patient ID: John Preston, male    DOB: 08-04-1956, 59 y.o.   MRN: [id number]  HPI   [name] is a 59 y.o. male who presents to the [facility] for a workers compensation follow up. Pt was seen here almost 1 month ago by Dr. Lorelei Pont for a right elbow injury that occurred at work 2 weeks prior to being seen. Diagnosed with lateral epicondylitis of right elbow and was prescribed indomethacin and counter force brace as well as avoid heavy lifting at work. Pt states his elbow is 100% better since the visit with Dr. Lorelei Pont. He still has some soreness with gripping and lifting objects but has had great improvement. He has been doing stretches as advised which have worked well for him. He has still been doing some lifting at work, to strengthen elbow, but tries to get the younger workers to do most of the heavy lifting. He is ready to go back to full duty at work.   Review of Systems  Constitutional: Negative for fever, chills, diaphoresis and fatigue.  Musculoskeletal: Positive for myalgias and arthralgias. Negative for joint swelling, neck pain and neck stiffness.  Skin: Negative for color change, pallor, rash and wound.  Neurological: Negative for weakness and numbness.    Objective:   Physical Exam  Constitutional: He is oriented to person, place, and time. He appears well-developed and well-nourished. No distress.  HENT:  Head: Normocephalic and atraumatic.  Eyes: Conjunctivae and EOM are normal. Pupils are equal, round, and reactive to light.  Neck: Neck supple.  Pulmonary/Chest: Effort normal.  Musculoskeletal: Normal range of motion.       Right shoulder: Normal. He exhibits normal range of motion, no tenderness, no bony tenderness, no swelling, no effusion, no pain, normal pulse and normal strength.      Right elbow: He exhibits normal range of motion, no swelling, no effusion, no deformity and no laceration. Tenderness found. Lateral epicondyle tenderness noted. No radial head, no medial epicondyle and no olecranon process tenderness noted.       Right wrist: Normal. He exhibits normal range of motion, no tenderness, no bony tenderness and no swelling.       Right forearm: Normal. He exhibits no tenderness, no bony tenderness, no swelling, no edema, no deformity and no laceration.  Lateral epicondyle tenderness.  Neurological: He is alert and oriented to person, place, and time.  Skin: Skin is warm and dry. He is not diaphoretic.  Psychiatric: He has a normal mood and affect. His behavior is normal.  Nursing note and vitals reviewed.  Assessment & Plan:  1. Right lateral epicondylitis -Improved/resolved. -Return to regular duty. -Follow-up PRN only.  2. Work related injury -Improved/resolved. -Regular duty.  By signing my name below, I, Raven Small, attest that this documentation has been prepared under the direction and in the presence of John Forts, MD.  Electronically Signed: Thea Alken, ED Scribe. 04/25/2015. 7:01 PM.  I personally performed the services described in this documentation, which was scribed in my presence. The recorded information has been reviewed and considered. John Preston, M.D. [facility] [address] [place], [place]  [id number] [phone] phone [phone] fax

## 2015-05-21 ENCOUNTER — Other Ambulatory Visit: Payer: Self-pay | Admitting: Family Medicine

## 2015-05-28 ENCOUNTER — Encounter: Payer: Self-pay | Admitting: Family Medicine

## 2015-05-28 DIAGNOSIS — G43909 Migraine, unspecified, not intractable, without status migrainosus: Secondary | ICD-10-CM | POA: Insufficient documentation

## 2015-06-23 ENCOUNTER — Other Ambulatory Visit: Payer: Self-pay | Admitting: Family Medicine

## 2015-10-02 ENCOUNTER — Encounter: Payer: Self-pay | Admitting: Neurology

## 2015-10-02 ENCOUNTER — Ambulatory Visit (INDEPENDENT_AMBULATORY_CARE_PROVIDER_SITE_OTHER): Payer: 59 | Admitting: Neurology

## 2015-10-02 VITALS — BP 142/88 | HR 86 | Resp 20 | Ht 69.5 in | Wt 205.0 lb

## 2015-10-02 DIAGNOSIS — F4322 Adjustment disorder with anxiety: Secondary | ICD-10-CM | POA: Diagnosis not present

## 2015-10-02 DIAGNOSIS — G4733 Obstructive sleep apnea (adult) (pediatric): Secondary | ICD-10-CM | POA: Diagnosis not present

## 2015-10-02 DIAGNOSIS — Z9989 Dependence on other enabling machines and devices: Secondary | ICD-10-CM

## 2015-10-02 NOTE — Patient Instructions (Signed)

## 2015-10-02 NOTE — Progress Notes (Signed)
SLEEP MEDICINE CLINIC   Provider:  Larey Seat, M D  Referring Provider: Wardell Honour, MD Primary Care Physician:  [name], MD  Chief Complaint  Patient presents with  . Follow-up    cpap follow up, rm 11, alone    HPI:  John Preston is a 60 y.o. caucasian, married, right handed  male , seen here as a referral from Dr. Tamala Julian for a sleep evaluation.   Dear Dr. Tamala Julian,  Thank you for entrusting your patient , John Preston , to my care.  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 He has been unable to enjoy restorative sleep from many month, likely a couple of years. He noted a progression i fatigue and excessive sleepiness over th last 18 month. His wife reports him to snore, but not to have apnea. He has frequent morning headaches, on occasion he has been woken by headaches. These HA last almost all day, resolve at dinner time.  He was treated with Imitrex after his sister reported good success with her migrainous headaches. It did not help him, and neither did Indocin.  He has throbbing headaches one side, left temple or forehead or behind the left eye, he has mild phono- and photophobia and has tunnel vision with Headaches. 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 He feels every morning as if he hadn't slept enough, is unrefreshed and unrestored. He struggles through his day at work and l takes a power nap at 11 .30 ( lunch break )  in his car for 30-40  minutes, until waking from sleep choking. He does not experience these when sleeping in bed, in supine.  He wakes up with a dry mouth and HA. The nap is not more refreshing that the nocturnal sleep. His bed time is around around 9 PM oldest the patient and his wife enjoy watching TV in the bedroom for about an hour. He will usually be asleep between 10 and 11 PM, his wife reports him to snore but has not witnessed apneas per se. The  patient had undergone prostate surgery for the treatment of medication nonresponsive hypertrophy and he has 1-2 nocturia is at night. Usually, he goes back to sleep. He rises at 6 AM and spontaneoulsy wakes in time, at 5.30 AM. He has 1 mug of coffee in AM for breakfast, and drinks in on his way to work, takes his medication in th his car , too. He often has a post nasal drip and takes allergy medication. He has been taking Lexapro and xanax for mood and sleep improvement.   He drinks alcohol daily, 2 glasses beer or wine  and this feels good , relaxing.  Mr.  Preston is here today for his first consultation after sleep study.09-27-14, His split-night polysomnography was performed on 07-04-14, his technologist was [facility]. He was diagnosed with an AHI of 22.5 per hour of sleep and an RDI of 24.9,  he had no periodic limb movements. See was 70% his lowest oxygen level was 87% during non-REM sleep in supine position.  He had only 18 minutes of sleep at or below 90% oxygen saturation. CPAP was initiated and raise the oxygen nadir to 91%, initially he had some trouble sleeping with the CPAP the very first night. Overall he seemed to have done well on only 6 cm water pressure under which she slept for 92.1  minutes and had 6.6 and minutes of REM sleep. He was then placed on an hour to titrate her and 8 cm water revealed was revealed as the best pressure for him. The download was quoted above. He has currently in a residual AHI of 2.297% compliance for over 4 hours of daily use in a 70 day download was 8 hours and 37 minutes of nightly use. The patient states that his wife has reported him to sleep less restless and that he feels better rested and refreshed in the morning he has also not lost any days at work. Mr. Trettin states that he still feels often a little groggy in the morning but as his day goes on he has no longer the heavy fatigue, which dragged him down. He now sleeps and dreams, he sleeps soundly. He dreams  a lot, often continues a dream after a short arousal period.  The dreams are vivid, not nightmarish. NO Sleep paralysis. No cataplexy. No sleep attacks.    Review of Systems: Out of a complete 14 system review, the patient complains of only the following symptoms, and all other reviewed systems are negative. He endorsed fatigue, eye redness and itching, hearing loss, headaches . Nov 2015 Epworth score 9 , Fatigue severity score 42   , depression score was not obtained. The dreams are vivid, not nightmarish. NO Sleep paralysis. No cataplexy. No sleep attacks.  09-27-14 : Epworth now on CPAP 8    FSS at  30  . Depression N/A .    Social History   Social History  . Marital Status: Married    Spouse Name: N/A  . Number of Children: 2  . Years of Education: N/A   Occupational History  . Sales    Social History Main Topics  . Smoking status: Never Smoker   . Smokeless tobacco: Never Used  . Alcohol Use: 9.0 oz/week    15 Glasses of wine per week     Comment: moderate 1-2 glasses of wine per day; more on weekends.  . Drug Use: No     Comment: prevoiusly used marijuana  . Sexual Activity: Yes   Other Topics Concern  . Not on file   Social History Narrative   Marital status:  Married x 32 years, happily.      Children: two adult children (Chelsea and Theresia Lo); no grandchildren.      Lives: with wife.      Tobacco:  None       Alcohol:   2 glasses of wine per night.  Usually 3 on weekends.  Some beer.      Drugs: none      Exercise: light; job very physically demanding.       Seatbelts:  Always uses seat belts.       Home safety:  Smoke alarm in the home. Carbon monoxide detector in the home.       Guns:  Guns in the home not stored in locked cabinet.       Caffeine use: 2 servings Coffee per day.    Family History  Problem Relation Age of Onset  . COPD Mother   . Diabetes Mother   . Heart disease Mother 59    cardiac stenting/CAD; no AMI  . Cancer Maternal Grandmother   .  Diabetes Maternal Grandmother   . Lung disease      Oxygen dependent  . Tics Father   . Dementia Father   . Mental illness Father  Past Medical History  Diagnosis Date  . Pain in joint, shoulder region   . Other specified viral warts   . Pure hypercholesterolemia   . Genital herpes, unspecified   . Other specified inflammatory disease of prostate     outlet obstruction with BPH.Urology consult 2006 [facility]  . Unspecified hearing loss   . Insomnia, unspecified   . Encounter for therapeutic drug monitoring   . Lumbago   . Internal hemorrhoids without mention of complication   . Anxiety state, unspecified   . Unspecified vitamin D deficiency   . Basal cell carcinoma of skin, site unspecified     L nasal; [facility].  . Genital herpes   . Depression   . Arthritis   . Internal hemorrhoids     colonoscopy 08/04/2008.  [facility].  . 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 Sleep related headaches 06/22/2014  . Sleep apnea   . Sleep apnea   . OSA on CPAP 09/27/2014    Past Surgical History  Procedure Laterality Date  . Knee surgery  1991  . Prostate surgery  2010    for BPH  . L. facial basal cell carcinoma resection  11/2010  . Colonoscopy  08/04/2008    internal hemorrhoids; repeat in 10 years.  [facility].  . Vasectomy      Current Outpatient Prescriptions  Medication Sig Dispense Refill  . acyclovir (ZOVIRAX) 800 MG tablet Take 1 tablet (800 mg total) by mouth 3 (three) times daily. 30 tablet 4  . amitriptyline (ELAVIL) 10 MG tablet Take 1-2 tablets (10-20 mg total) by mouth at bedtime. 60 tablet 5  . DULoxetine (CYMBALTA) 30 MG capsule TAKE 1 CAPSULE (30 MG TOTAL) BY MOUTH DAILY 30 capsule 3  . indomethacin (INDOCIN) 25 MG capsule Take 2 capsules (50 mg total) by mouth 2 (two) times daily with a meal. 60 capsule 1  . Multiple Vitamin (MULTIVITAMIN) tablet Take 1 tablet by mouth daily.    . rizatriptan (MAXALT-MLT) 10 MG disintegrating tablet Take 1 tablet (10 mg total)  by mouth as needed for migraine. May repeat in 2 hours if needed 8 tablet 5  . simvastatin (ZOCOR) 80 MG tablet Take 0.5 tablets (40 mg total) by mouth at bedtime. 30 tablet 5  . SUMAtriptan (IMITREX) 50 MG tablet Take 1 tablet (50 mg total) by mouth 4 (four) times daily as needed for migraine or headache. May repeat in 2 hours after 1st dose  if headache persists or recurs. Not to exceed 300 mg in 24 hours 10 tablet 5   No current facility-administered medications for this visit.    Allergies as of 10/02/2015  . (No Known Allergies)    Vitals: There were no vitals taken for this visit. Last Weight:  Wt Readings from Last 1 Encounters:  04/25/15 210 lb 6 oz (95.425 kg)       Last Height:   Ht Readings from Last 1 Encounters:  04/25/15 5\' 9"  (1.753 m)    Physical exam:  General: The patient is awake, alert and appears not in acute distress. The patient is well groomed. Head: Normocephalic, atraumatic. Neck is supple. Mallampati 3  neck circumference: 17.0 . Nasal airflow restricted , TMJ is not  evident . Retrognathia is not noted.  Cardiovascular:  Regular rate and rhythm , without  murmurs or carotid bruit, and without distended neck veins. Respiratory: Lungs are clear to auscultation. Skin:  Without evidence of edema, or rash, Tattoes.  Trunk: BMI is elevated and patient has normal  posture.  Neurologic exam : The patient is awake and alert, oriented to place and time.   Memory subjective described as intact. There is a normal attention span & concentration ability.  Speech is fluent without  dysarthria, dysphonia or aphasia.  Mood and affect are nervous, anxious, jittery.  Cranial nerves: Pupils are equal and briskly reactive to light.  Extraocular movements  in vertical and horizontal planes intact and without nystagmus. Visual fields by finger perimetry are intact.Hearing to finger rub intact. Facial sensation intact to fine touch. Facial motor strength is symmetric and  tongue and uvula move midline.  Assessment: 25 minutes physical and neurologic examination, review of laboratory studies, imaging, neurophysiology testing and pre-existing records, assessment is  Very likely, this patient has a degree of OSA, but could be a hypo-ventilator as well. He uses habitually Xanax and drinks some alcohol every night , is a loud snorer, and has no restorative .  1)  Sleep apnea, AHI of 22 in December 2015.  The patient was titrated to CPAP at  8 cm water pressure with 1 cm EPR and uses a chin strap, residual AHI was 2.2 . He was highly compliant 97% compliance rating on 09-25-14.  He is again highly compliant in 2017 at 93% for hours of daily use. He has used the machine on average 6 hours and 16 minutes at night,   Newly set pressure is 6 cm water was 17 m EPR now residual AHI is 1.4 an excellent resolution . He changed from a nasal interface to a fullface interface based on his continued oral breathing habit. This caused him to shave because he had high air leaks.   Calls [facility] [phone].   2)This patient has had nocturnal palpitations, now in daytime as well , he took his own pulse - I advised him to reduce caffeine intake. Watch alcohol intake.  He will see Dr. Tamala Julian  This week to get evaluated. Visit duration was 20 minutes.    Plan:  Treatment plan and additional workup :   Continue CPAP at  6 cm water pressure with 1 cm EPR revisit will be in one year from now.  I would like a 20 minute visit duration. Reduce your alcohol intake to one drink with dinner 3 times a week. Marland Kitchen  Reserve Xanax as needed.  Await input from [place].       Asencion Partridge Edye Hainline MD  10/02/2015

## 2015-10-03 ENCOUNTER — Ambulatory Visit (INDEPENDENT_AMBULATORY_CARE_PROVIDER_SITE_OTHER): Payer: 59 | Admitting: Family Medicine

## 2015-10-03 VITALS — BP 126/90 | HR 76 | Temp 97.9°F | Resp 20 | Ht 69.0 in | Wt 208.8 lb

## 2015-10-03 DIAGNOSIS — N400 Enlarged prostate without lower urinary tract symptoms: Secondary | ICD-10-CM

## 2015-10-03 DIAGNOSIS — F411 Generalized anxiety disorder: Secondary | ICD-10-CM | POA: Diagnosis not present

## 2015-10-03 DIAGNOSIS — R51 Headache: Secondary | ICD-10-CM | POA: Diagnosis not present

## 2015-10-03 DIAGNOSIS — E7800 Pure hypercholesterolemia, unspecified: Secondary | ICD-10-CM | POA: Diagnosis not present

## 2015-10-03 DIAGNOSIS — Z9989 Dependence on other enabling machines and devices: Secondary | ICD-10-CM

## 2015-10-03 DIAGNOSIS — R7309 Other abnormal glucose: Secondary | ICD-10-CM

## 2015-10-03 DIAGNOSIS — R002 Palpitations: Secondary | ICD-10-CM

## 2015-10-03 DIAGNOSIS — Z114 Encounter for screening for human immunodeficiency virus [HIV]: Secondary | ICD-10-CM | POA: Diagnosis not present

## 2015-10-03 DIAGNOSIS — G43709 Chronic migraine without aura, not intractable, without status migrainosus: Secondary | ICD-10-CM

## 2015-10-03 DIAGNOSIS — R519 Headache, unspecified: Secondary | ICD-10-CM

## 2015-10-03 DIAGNOSIS — Z1159 Encounter for screening for other viral diseases: Secondary | ICD-10-CM | POA: Diagnosis not present

## 2015-10-03 DIAGNOSIS — M5136 Other intervertebral disc degeneration, lumbar region: Secondary | ICD-10-CM | POA: Diagnosis not present

## 2015-10-03 DIAGNOSIS — G4700 Insomnia, unspecified: Secondary | ICD-10-CM | POA: Diagnosis not present

## 2015-10-03 DIAGNOSIS — Z0000 Encounter for general adult medical examination without abnormal findings: Secondary | ICD-10-CM | POA: Diagnosis not present

## 2015-10-03 DIAGNOSIS — G4733 Obstructive sleep apnea (adult) (pediatric): Secondary | ICD-10-CM

## 2015-10-03 LAB — POCT URINALYSIS DIP (MANUAL ENTRY)
Bilirubin, UA: NEGATIVE
GLUCOSE UA: NEGATIVE
Ketones, POC UA: NEGATIVE
Leukocytes, UA: NEGATIVE
NITRITE UA: NEGATIVE
Protein Ur, POC: NEGATIVE
RBC UA: NEGATIVE
Spec Grav, UA: 1.015
UROBILINOGEN UA: 0.2
pH, UA: 7

## 2015-10-03 MED ORDER — AMITRIPTYLINE HCL 25 MG PO TABS: 25.0000 mg | ORAL_TABLET | Freq: Every day | ORAL | Status: DC

## 2015-10-03 MED ORDER — DULOXETINE HCL 60 MG PO CPEP: 60.0000 mg | ORAL_CAPSULE | Freq: Every day | ORAL | Status: DC

## 2015-10-03 MED ORDER — SIMVASTATIN 80 MG PO TABS: 40.0000 mg | ORAL_TABLET | Freq: Every day | ORAL | Status: DC

## 2015-10-03 NOTE — Patient Instructions (Addendum)
Because you received labwork today, you will receive an invoice from [employer]. Please contact [facility] at [phone] with questions or concerns regarding your invoice. Our billing staff will not be able to assist you with those questions.  You will be contacted with the lab results as soon as they are available. The fastest way to get your results is to activate your My Chart account. Instructions are located on the last page of this paperwork. If you have not heard from [place] regarding the results in 2 weeks, please contact this office.   Keeping you healthy  Get these tests  Blood pressure- Have your blood pressure checked once a year by your healthcare provider.  Normal blood pressure is 120/80  Weight- Have your body mass index (BMI) calculated to screen for obesity.  BMI is a measure of body fat based on height and weight. You can also calculate your own BMI at ViewBanking.si.  Cholesterol- Have your cholesterol checked every year.  Diabetes- Have your blood sugar checked regularly if you have high blood pressure, high cholesterol, have a family history of diabetes or if you are overweight.  Screening for Colon Cancer- Colonoscopy starting at age 60.  Screening may begin sooner depending on your family history and other health conditions. Follow up colonoscopy as directed by your Gastroenterologist.  Screening for Prostate Cancer- Both blood work (PSA) and a rectal exam help screen for Prostate Cancer.  Screening begins at age 31 with African-American men and at age 65 with Caucasian men.  Screening may begin sooner depending on your family history.  Take these medicines  Aspirin- One aspirin daily can help prevent Heart disease and Stroke.  Flu shot- Every fall.  Tetanus- Every 10 years.  Zostavax- Once after the age of 88 to prevent Shingles.  Pneumonia shot- Once after the age of 74; if you are younger than 22, ask your healthcare provider  if you need a Pneumonia shot.  Take these steps  Don't smoke- If you do smoke, talk to your doctor about quitting.  For tips on how to quit, go to www.smokefree.gov or call 1-800-QUIT-NOW.  Be physically active- Exercise 5 days a week for at least 30 minutes.  If you are not already physically active start slow and gradually work up to 30 minutes of moderate physical activity.  Examples of moderate activity include walking briskly, mowing the yard, dancing, swimming, bicycling, etc.  Eat a healthy diet- Eat a variety of healthy food such as fruits, vegetables, low fat milk, low fat cheese, yogurt, lean meant, poultry, fish, beans, tofu, etc. For more information go to www.thenutritionsource.org  Drink alcohol in moderation- Limit alcohol intake to less than two drinks a day. Never drink and drive.  Dentist- Brush and floss twice daily; visit your dentist twice a year.  Depression- Your emotional health is as important as your physical health. If you're feeling down, or losing interest in things you would normally enjoy please talk to your healthcare provider.  Eye exam- Visit your eye doctor every year.  Safe sex- If you may be exposed to a sexually transmitted infection, use a condom.  Seat belts- Seat belts can save your life; always wear one.  Smoke/Carbon Monoxide detectors- These detectors need to be installed on the appropriate level of your home.  Replace batteries at least once a year.  Skin cancer- When out in the sun, cover up and use sunscreen 15 SPF or higher.  Violence- If anyone is threatening you, please  tell your healthcare provider. Living Will/ Health care power of attorney- Speak with your healthcare provider and family.

## 2015-10-03 NOTE — Progress Notes (Signed)
Subjective:    Patient ID: John Preston, male    DOB: 03/20/1956, 60 y.o.   MRN: [id number]  10/03/2015  Annual Exam and Medication Refill   HPI This 60 y.o. male presents for Complete Physical Examination:  Last physical: Colonoscopy: TDAP: Pneumovax: Zostavax: Influenza: Eye exam: Dental exam:   Migraine: Maxalt working really well.  2 refills of Maxalt.  Getting less than one per month. Since September, has not missed work due to headache.    Insomnia: not sleeping with Amitriptyline 20mg  qhs.    Anxiety and depression:  Added Cymbalta 30mg  daily; no Lexapro since last visit. Review of Systems  Constitutional: Negative for fever, chills, diaphoresis, activity change, appetite change, fatigue and unexpected weight change.  HENT: Negative for congestion, dental problem, drooling, ear discharge, ear pain, facial swelling, hearing loss, mouth sores, nosebleeds, postnasal drip, rhinorrhea, sinus pressure, sneezing, sore throat, tinnitus, trouble swallowing and voice change.   Eyes: Negative for photophobia, pain, discharge, redness, itching and visual disturbance.  Respiratory: Negative for apnea, cough, choking, chest tightness, shortness of breath, wheezing and stridor.   Cardiovascular: Negative for chest pain, palpitations and leg swelling.  Gastrointestinal: Negative for nausea, vomiting, abdominal pain, diarrhea, constipation and blood in stool.  Endocrine: Negative for cold intolerance, heat intolerance, polydipsia, polyphagia and polyuria.  Genitourinary: Negative for dysuria, urgency, frequency, hematuria, flank pain, decreased urine volume, discharge, penile swelling, scrotal swelling, enuresis, difficulty urinating, genital sores, penile pain and testicular pain.  Musculoskeletal: Negative for myalgias, back pain, joint swelling, arthralgias, gait problem, neck pain and neck stiffness.  Skin: Negative for color change, pallor, rash and wound.  Allergic/Immunologic:  Negative for environmental allergies, food allergies and immunocompromised state.  Neurological: Negative for dizziness, tremors, seizures, syncope, facial asymmetry, speech difficulty, weakness, light-headedness, numbness and headaches.  Hematological: Negative for adenopathy. Does not bruise/bleed easily.  Psychiatric/Behavioral: Negative for suicidal ideas, hallucinations, behavioral problems, confusion, sleep disturbance, self-injury, dysphoric mood, decreased concentration and agitation. The patient is not nervous/anxious and is not hyperactive.     Past Medical History  Diagnosis Date  . Pain in joint, shoulder region   . Other specified viral warts   . Pure hypercholesterolemia   . Genital herpes, unspecified   . Other specified inflammatory disease of prostate     outlet obstruction with BPH.Urology consult 2006 [facility]  . Unspecified hearing loss   . Insomnia, unspecified   . Encounter for therapeutic drug monitoring   . Lumbago   . Internal hemorrhoids without mention of complication   . Anxiety state, unspecified   . Unspecified vitamin D deficiency   . Basal cell carcinoma of skin, site unspecified     L nasal; [facility].  . Genital herpes   . Depression   . Arthritis   . Internal hemorrhoids     colonoscopy 08/04/2008.  [facility].  . 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 Sleep related headaches 06/22/2014  . Sleep apnea   . Sleep apnea   . OSA on CPAP 09/27/2014   Past Surgical History  Procedure Laterality Date  . Knee surgery  1991  . Prostate surgery  2010    for BPH  . L. facial basal cell carcinoma resection  11/2010  . Colonoscopy  08/04/2008    internal hemorrhoids; repeat in 10 years.  [facility].  . Vasectomy     No Known Allergies  Social History   Social History  . Marital Status: Married    Spouse Name: Kieth Brightly  . Number of Children: 2  .  Years of Education: N/A   Occupational History  . Sales    Social History Main Topics  . Smoking  status: Never Smoker   . Smokeless tobacco: Never Used  . Alcohol Use: 9.0 oz/week    15 Glasses of wine per week     Comment: moderate 1-2 glasses of wine per day; more on weekends.  . Drug Use: No     Comment: prevoiusly used marijuana  . Sexual Activity: Yes   Other Topics Concern  . Not on file   Social History Narrative   Marital status:  Married x 32 years, happily.      Children: two adult children (Chelsea and Theresia Lo); no grandchildren.      Lives: with wife.      Tobacco:  None       Alcohol:   2 glasses of wine per night.  Usually 3 on weekends.  Some beer.      Drugs: none      Exercise: light; job very physically demanding.       Seatbelts:  Always uses seat belts.       Home safety:  Smoke alarm in the home. Carbon monoxide detector in the home.       Guns:  Guns in the home not stored in locked cabinet.       Caffeine use: 2 servings Coffee per day.   Family History  Problem Relation Age of Onset  . COPD Mother   . Diabetes Mother   . Heart disease Mother 37    cardiac stenting/CAD; no AMI  . Cancer Maternal Grandmother   . Diabetes Maternal Grandmother   . Lung disease      Oxygen dependent  . Tics Father   . Dementia Father   . Mental illness Father        Objective:    BP 126/90 mmHg  Pulse 76  Temp(Src) 97.9 F (36.6 C) (Oral)  Resp 20  Ht 5\' 9"  (1.753 m)  Wt 208 lb 12.8 oz (94.711 kg)  BMI 30.82 kg/m2  SpO2 96% Physical Exam  Constitutional: He is oriented to person, place, and time. He appears well-developed and well-nourished. No distress.  HENT:  Head: Normocephalic and atraumatic.  Right Ear: External ear normal.  Left Ear: External ear normal.  Nose: Nose normal.  Mouth/Throat: Oropharynx is clear and moist.  Eyes: Conjunctivae and EOM are normal. Pupils are equal, round, and reactive to light.  Neck: Normal range of motion. Neck supple. Carotid bruit is not present. No thyromegaly present.  Cardiovascular: Normal rate, regular  rhythm, normal heart sounds and intact distal pulses.  Exam reveals no gallop and no friction rub.   No murmur heard. Pulmonary/Chest: Effort normal and breath sounds normal. He has no wheezes. He has no rales.  Abdominal: Soft. Bowel sounds are normal. He exhibits no distension and no mass. There is no tenderness. There is no rebound and no guarding.  Musculoskeletal:       Right shoulder: Normal.       Left shoulder: Normal.       Cervical back: Normal.  Lymphadenopathy:    He has no cervical adenopathy.  Neurological: He is alert and oriented to person, place, and time. He has normal reflexes. No cranial nerve deficit. He exhibits normal muscle tone. Coordination normal.  Skin: Skin is warm and dry. No rash noted. He is not diaphoretic.  Psychiatric: He has a normal mood and affect. His behavior is normal. Judgment and  thought content normal.        Assessment & Plan:   1. Routine physical examination   2. Palpitations   3. Pure hypercholesterolemia   4. Other abnormal glucose   5. Generalized anxiety disorder   6. DDD (degenerative disc disease), lumbar   7. Insomnia   8. Sleep related headaches   9. OSA on CPAP   10. Chronic migraine without aura without status migrainosus, not intractable   11. Screening for HIV (human immunodeficiency virus)   12. Need for hepatitis C screening test   13. BPH (benign prostatic hyperplasia)     Orders Placed This Encounter  Procedures  . CBC with Differential/Platelet  . Comprehensive metabolic panel    Order Specific Question:  Has the patient fasted?    Answer:  Yes  . Hemoglobin A1c  . Hepatitis C antibody  . HIV antibody  . Lipid panel    Order Specific Question:  Has the patient fasted?    Answer:  Yes  . PSA  . TSH  . Vitamin B12  . VITAMIN D 25 Hydroxy (Vit-D Deficiency, Fractures)  . Ambulatory referral to Cardiology    Referral Priority:  Routine    Referral Type:  Consultation    Referral Reason:  Specialty  Services Required    Requested Specialty:  Cardiology    Number of Visits Requested:  1  . POCT urinalysis dipstick  . EKG 12-Lead   Meds ordered this encounter  Medications  . amitriptyline (ELAVIL) 25 MG tablet    Sig: Take 1-2 tablets (25-50 mg total) by mouth at bedtime.    Dispense:  180 tablet    Refill:  1  . DULoxetine (CYMBALTA) 60 MG capsule    Sig: Take 1 capsule (60 mg total) by mouth daily.    Dispense:  90 capsule    Refill:  1  . simvastatin (ZOCOR) 80 MG tablet    Sig: Take 0.5 tablets (40 mg total) by mouth at bedtime.    Dispense:  30 tablet    Refill:  5    Return in about 6 months (around 04/04/2016) for recheck.    Ashia Dehner Elayne Guerin, M.D. [facility] [address] [place], [place]  [address] [phone] phone [phone] fax

## 2015-10-04 LAB — COMPREHENSIVE METABOLIC PANEL
ALK PHOS: 63 U/L (ref 40–115)
ALT: 113 U/L — AB (ref 9–46)
AST: 57 U/L — ABNORMAL HIGH (ref 10–35)
Albumin: 4.7 g/dL (ref 3.6–5.1)
BILIRUBIN TOTAL: 0.7 mg/dL (ref 0.2–1.2)
BUN: 10 mg/dL (ref 7–25)
CO2: 31 mmol/L (ref 20–31)
CREATININE: 0.98 mg/dL (ref 0.70–1.33)
Calcium: 9.6 mg/dL (ref 8.6–10.3)
Chloride: 98 mmol/L (ref 98–110)
Glucose, Bld: 113 mg/dL — ABNORMAL HIGH (ref 65–99)
POTASSIUM: 4.3 mmol/L (ref 3.5–5.3)
SODIUM: 138 mmol/L (ref 135–146)
TOTAL PROTEIN: 7.2 g/dL (ref 6.1–8.1)

## 2015-10-04 LAB — CBC WITH DIFFERENTIAL/PLATELET
BASOS ABS: 0 10*3/uL (ref 0.0–0.1)
Basophils Relative: 0 % (ref 0–1)
EOS ABS: 0.2 10*3/uL (ref 0.0–0.7)
Eosinophils Relative: 4 % (ref 0–5)
HCT: 46.8 % (ref 39.0–52.0)
Hemoglobin: 16 g/dL (ref 13.0–17.0)
LYMPHS ABS: 1.8 10*3/uL (ref 0.7–4.0)
Lymphocytes Relative: 32 % (ref 12–46)
MCH: 31.8 pg (ref 26.0–34.0)
MCHC: 34.2 g/dL (ref 30.0–36.0)
MCV: 93 fL (ref 78.0–100.0)
MPV: 9.5 fL (ref 8.6–12.4)
Monocytes Absolute: 0.5 10*3/uL (ref 0.1–1.0)
Monocytes Relative: 8 % (ref 3–12)
NEUTROS PCT: 56 % (ref 43–77)
Neutro Abs: 3.2 10*3/uL (ref 1.7–7.7)
PLATELETS: 238 10*3/uL (ref 150–400)
RBC: 5.03 MIL/uL (ref 4.22–5.81)
RDW: 13 % (ref 11.5–15.5)
WBC: 5.7 10*3/uL (ref 4.0–10.5)

## 2015-10-04 LAB — HEMOGLOBIN A1C
HEMOGLOBIN A1C: 5.9 % — AB (ref <5.7)
Mean Plasma Glucose: 123 mg/dL — ABNORMAL HIGH (ref <117)

## 2015-10-04 LAB — HEPATITIS C ANTIBODY: HCV Ab: NEGATIVE

## 2015-10-04 LAB — LIPID PANEL
CHOL/HDL RATIO: 5 ratio (ref <=5.0)
Cholesterol: 223 mg/dL — ABNORMAL HIGH (ref 125–200)
HDL: 45 mg/dL (ref >=40)
LDL CALC: 109 mg/dL (ref <130)
Triglycerides: 344 mg/dL — ABNORMAL HIGH (ref <150)
VLDL: 69 mg/dL — AB (ref <30)

## 2015-10-04 LAB — TSH: TSH: 2.81 m[IU]/L (ref 0.40–4.50)

## 2015-10-04 LAB — VITAMIN B12: Vitamin B-12: 495 pg/mL (ref 200–1100)

## 2015-10-05 LAB — PSA: PSA: 0.52 ng/mL (ref <=4.00)

## 2015-10-05 LAB — VITAMIN D 25 HYDROXY (VIT D DEFICIENCY, FRACTURES): VIT D 25 HYDROXY: 26 ng/mL — AB (ref 30–100)

## 2015-10-06 LAB — HIV ANTIBODY (ROUTINE TESTING W REFLEX): HIV 1&2 Ab, 4th Generation: NONREACTIVE

## 2015-10-10 ENCOUNTER — Ambulatory Visit (INDEPENDENT_AMBULATORY_CARE_PROVIDER_SITE_OTHER): Payer: 59 | Admitting: Family Medicine

## 2015-10-10 VITALS — BP 130/82 | HR 98 | Temp 98.4°F | Resp 18 | Ht 68.5 in | Wt 208.6 lb

## 2015-10-10 DIAGNOSIS — R7302 Impaired glucose tolerance (oral): Secondary | ICD-10-CM | POA: Diagnosis not present

## 2015-10-10 DIAGNOSIS — E785 Hyperlipidemia, unspecified: Secondary | ICD-10-CM | POA: Diagnosis not present

## 2015-10-10 DIAGNOSIS — R7989 Other specified abnormal findings of blood chemistry: Secondary | ICD-10-CM | POA: Diagnosis not present

## 2015-10-10 DIAGNOSIS — R002 Palpitations: Secondary | ICD-10-CM

## 2015-10-10 DIAGNOSIS — R945 Abnormal results of liver function studies: Principal | ICD-10-CM

## 2015-10-10 DIAGNOSIS — E559 Vitamin D deficiency, unspecified: Secondary | ICD-10-CM | POA: Diagnosis not present

## 2015-10-10 NOTE — Patient Instructions (Signed)

## 2015-10-10 NOTE — Progress Notes (Signed)
Subjective:    Patient ID: John Preston, male    DOB: October 14, 1955, 60 y.o.   MRN: [id number]  10/10/2015  Palpitations   HPI This 60 y.o. male presents for evaluation of palpitations. Patient presenting for increasing frequency of palpitations.  Really thinks that having PVCs; has researched and feels that suffering with PVCs.  Have become very frequent.  Evaluated one week ago; the cardiologist called 48 hours later.  Called back today and has had constant PVCs/palpitations.  Very sporadic; no pattern; very random.  Triggers are stress, alcohol, caffeine.  Sleeping really well.  Started four Amitriptyline 10mg  daily and sleeping really well.   Very aware of it.  Can feel the flutter.  Flip flopping feeling is exactly what it is doing.  No Cp, dizziness, lightheadedness, shortness of breath.  Gets panicky about it.    Elevated LFTs:  At labs last visit; had been to the beach the weekend prior to visit; had not had alcohol for two days prior to visit due to palpitations.  Glucose intolerance: HgbA1c of 5.9 last week.  Hyperlipidemia: Patient reports good compliance with medication, good tolerance to medication, and good symptom control.  Cholesterol 20 points higher than usual.  Vitamin D deficiency:  Level 26 last week.   Review of Systems  Constitutional: Negative for fever, chills, diaphoresis, activity change, appetite change and fatigue.  Respiratory: Negative for cough and shortness of breath.   Cardiovascular: Positive for palpitations. Negative for chest pain and leg swelling.  Gastrointestinal: Negative for nausea, vomiting, abdominal pain and diarrhea.  Endocrine: Negative for cold intolerance, heat intolerance, polydipsia, polyphagia and polyuria.  Skin: Negative for color change, rash and wound.  Neurological: Negative for dizziness, tremors, seizures, syncope, facial asymmetry, speech difficulty, weakness, light-headedness, numbness and headaches.  Psychiatric/Behavioral:  Negative for sleep disturbance and dysphoric mood. The patient is not nervous/anxious.     Past Medical History  Diagnosis Date  . Pain in joint, shoulder region   . Other specified viral warts   . Pure hypercholesterolemia   . Genital herpes, unspecified   . Other specified inflammatory disease of prostate     outlet obstruction with BPH.Urology consult 2006 [facility]  . Unspecified hearing loss   . Insomnia, unspecified   . Encounter for therapeutic drug monitoring   . Lumbago   . Internal hemorrhoids without mention of complication   . Anxiety state, unspecified   . Unspecified vitamin D deficiency   . Basal cell carcinoma of skin, site unspecified     L nasal; [facility].  . Genital herpes   . Depression   . Arthritis   . Internal hemorrhoids     colonoscopy 08/04/2008.  [facility].  . 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 Sleep related headaches 06/22/2014  . Sleep apnea   . Sleep apnea   . OSA on CPAP 09/27/2014   Past Surgical History  Procedure Laterality Date  . Knee surgery  1991  . Prostate surgery  2010    for BPH  . L. facial basal cell carcinoma resection  11/2010  . Colonoscopy  08/04/2008    internal hemorrhoids; repeat in 10 years.  [facility].  . Vasectomy     No Known Allergies Current Outpatient Prescriptions  Medication Sig Dispense Refill  . acyclovir (ZOVIRAX) 800 MG tablet Take 1 tablet (800 mg total) by mouth 3 (three) times daily. 30 tablet 4  . amitriptyline (ELAVIL) 25 MG tablet Take 1-2 tablets (25-50 mg total) by mouth at bedtime. 180 tablet  1  . DULoxetine (CYMBALTA) 60 MG capsule Take 1 capsule (60 mg total) by mouth daily. 90 capsule 1  . indomethacin (INDOCIN) 25 MG capsule Take 2 capsules (50 mg total) by mouth 2 (two) times daily with a meal. 60 capsule 1  . Multiple Vitamin (MULTIVITAMIN) tablet Take 1 tablet by mouth daily.    . rizatriptan (MAXALT-MLT) 10 MG disintegrating tablet Take 1 tablet (10 mg total) by mouth as needed for  migraine. May repeat in 2 hours if needed 8 tablet 5  . simvastatin (ZOCOR) 80 MG tablet Take 0.5 tablets (40 mg total) by mouth at bedtime. 30 tablet 5  . ALPRAZolam (XANAX) 1 MG tablet Take 1 mg by mouth at bedtime as needed for anxiety. Reported on 10/10/2015     No current facility-administered medications for this visit.   Social History   Social History  . Marital Status: Married    Spouse Name: Kieth Brightly  . Number of Children: 2  . Years of Education: N/A   Occupational History  . Sales    Social History Main Topics  . Smoking status: Never Smoker   . Smokeless tobacco: Never Used  . Alcohol Use: 9.0 oz/week    15 Glasses of wine per week     Comment: moderate 1-2 glasses of wine per day; more on weekends.  . Drug Use: No     Comment: prevoiusly used marijuana  . Sexual Activity: Yes   Other Topics Concern  . Not on file   Social History Narrative   Marital status:  Married x 32 years, happily.      Children: two adult children (Chelsea and Theresia Lo); no grandchildren.      Lives: with wife.      Tobacco:  None       Alcohol:   2 glasses of wine per night.  Usually 3 on weekends.  Some beer.      Drugs: none      Exercise: light; job very physically demanding.       Seatbelts:  Always uses seat belts.       Home safety:  Smoke alarm in the home. Carbon monoxide detector in the home.       Guns:  Guns in the home not stored in locked cabinet.       Caffeine use: 2 servings Coffee per day.   Family History  Problem Relation Age of Onset  . COPD Mother   . Diabetes Mother   . Heart disease Mother 4    cardiac stenting/CAD; no AMI  . Cancer Maternal Grandmother   . Diabetes Maternal Grandmother   . Lung disease      Oxygen dependent  . Tics Father   . Dementia Father   . Mental illness Father        Objective:    BP 130/82 mmHg  Pulse 98  Temp(Src) 98.4 F (36.9 C) (Oral)  Resp 18  Ht 5' 8.5" (1.74 m)  Wt 208 lb 9.6 oz (94.62 kg)  BMI 31.25 kg/m2  SpO2  98% Physical Exam  Constitutional: He is oriented to person, place, and time. He appears well-developed and well-nourished. No distress.  HENT:  Head: Normocephalic and atraumatic.  Right Ear: External ear normal.  Left Ear: External ear normal.  Nose: Nose normal.  Mouth/Throat: Oropharynx is clear and moist.  Eyes: Conjunctivae and EOM are normal. Pupils are equal, round, and reactive to light.  Neck: Normal range of motion. Neck supple. Carotid bruit  is not present. No thyromegaly present.  Cardiovascular: Normal rate, regular rhythm, normal heart sounds and intact distal pulses.  Exam reveals no gallop and no friction rub.   No murmur heard. +frequent PVCs  Pulmonary/Chest: Effort normal and breath sounds normal. He has no wheezes. He has no rales.  Abdominal: Soft. Bowel sounds are normal. He exhibits no distension and no mass. There is no tenderness. There is no rebound and no guarding.  Lymphadenopathy:    He has no cervical adenopathy.  Neurological: He is alert and oriented to person, place, and time. No cranial nerve deficit.  Skin: Skin is warm and dry. No rash noted. He is not diaphoretic.  Psychiatric: He has a normal mood and affect. His behavior is normal.  Nursing note and vitals reviewed.  EKG; NSR     Assessment & Plan:   1. Elevated LFTs   2. Palpitations   3. Glucose intolerance (impaired glucose tolerance)   4. Hyperlipidemia   5. Vitamin D deficiency     Orders Placed This Encounter  Procedures  . Comprehensive metabolic panel  . EKG 12-Lead   No orders of the defined types were placed in this encounter.    No Follow-up on file.    Seraiah Nowack Elayne Guerin, M.D. [facility] [address] [place], [place]  [address] [phone] phone [phone] fax

## 2015-10-11 LAB — COMPREHENSIVE METABOLIC PANEL
ALK PHOS: 55 U/L (ref 40–115)
ALT: 53 U/L — AB (ref 9–46)
AST: 27 U/L (ref 10–35)
Albumin: 4.4 g/dL (ref 3.6–5.1)
BUN: 16 mg/dL (ref 7–25)
CALCIUM: 9.1 mg/dL (ref 8.6–10.3)
CO2: 28 mmol/L (ref 20–31)
Chloride: 96 mmol/L — ABNORMAL LOW (ref 98–110)
Creat: 0.98 mg/dL (ref 0.70–1.33)
Glucose, Bld: 100 mg/dL — ABNORMAL HIGH (ref 65–99)
POTASSIUM: 3.9 mmol/L (ref 3.5–5.3)
Sodium: 138 mmol/L (ref 135–146)
TOTAL PROTEIN: 7 g/dL (ref 6.1–8.1)
Total Bilirubin: 0.6 mg/dL (ref 0.2–1.2)

## 2015-10-19 ENCOUNTER — Ambulatory Visit: Payer: Self-pay | Admitting: Cardiology

## 2015-10-20 ENCOUNTER — Encounter: Payer: Self-pay | Admitting: Family Medicine

## 2015-11-04 ENCOUNTER — Encounter: Payer: Self-pay | Admitting: Family Medicine

## 2015-11-07 ENCOUNTER — Other Ambulatory Visit: Payer: Self-pay | Admitting: Family Medicine

## 2015-12-11 ENCOUNTER — Other Ambulatory Visit: Payer: Self-pay | Admitting: Family Medicine

## 2016-02-13 ENCOUNTER — Telehealth: Payer: Self-pay

## 2016-02-13 DIAGNOSIS — E7800 Pure hypercholesterolemia, unspecified: Secondary | ICD-10-CM

## 2016-02-13 NOTE — Telephone Encounter (Signed)
Patient needs his simvastatin (ZOCOR) 80 MG  Refilled he states that he is prescribed 80mg  but he only takes half so its actually 40mg  and [facility] needs [facility] [place].  His call back number is [phone]

## 2016-02-15 MED ORDER — SIMVASTATIN 80 MG PO TABS: 40.0000 mg | ORAL_TABLET | Freq: Every day | ORAL | Status: DC

## 2016-02-15 NOTE — Telephone Encounter (Signed)
Pt called back and stated that his pharm has the correct ins, but we do not. Called pharm and spoke to pharm. We tried having her run the 40 mg tabs, 1 per day instead of the 80 mg and it went through that way. Pharm will DC the 80 mg and took VO for 40 mg tabs 1 QD, #90. Called pt back to notify.

## 2016-02-15 NOTE — Telephone Encounter (Signed)
Sent in a 90 day supply. Pharm had sent notice of PA needed. Simvastatin should not need one, but it looks like pharm was running Rx under incorrect ins. I wrote a note to pharm and faxed back. Notified pt on VM of all of this.

## 2016-04-09 ENCOUNTER — Other Ambulatory Visit: Payer: Self-pay | Admitting: Family Medicine

## 2016-04-09 MED ORDER — DULOXETINE HCL 60 MG PO CPEP: 60.0000 mg | ORAL_CAPSULE | Freq: Every day | ORAL | 0 refills | Status: DC

## 2016-04-09 MED ORDER — ACYCLOVIR 800 MG PO TABS: 800.0000 mg | ORAL_TABLET | Freq: Three times a day (TID) | ORAL | 4 refills | Status: DC

## 2016-04-16 ENCOUNTER — Ambulatory Visit (INDEPENDENT_AMBULATORY_CARE_PROVIDER_SITE_OTHER): Payer: BLUE CROSS/BLUE SHIELD | Admitting: Family Medicine

## 2016-04-16 ENCOUNTER — Encounter: Payer: Self-pay | Admitting: Family Medicine

## 2016-04-16 DIAGNOSIS — F411 Generalized anxiety disorder: Secondary | ICD-10-CM

## 2016-04-16 DIAGNOSIS — G4733 Obstructive sleep apnea (adult) (pediatric): Secondary | ICD-10-CM

## 2016-04-16 DIAGNOSIS — Z9989 Dependence on other enabling machines and devices: Secondary | ICD-10-CM

## 2016-04-16 DIAGNOSIS — R7309 Other abnormal glucose: Secondary | ICD-10-CM | POA: Diagnosis not present

## 2016-04-16 DIAGNOSIS — M5136 Other intervertebral disc degeneration, lumbar region: Secondary | ICD-10-CM | POA: Diagnosis not present

## 2016-04-16 DIAGNOSIS — E7800 Pure hypercholesterolemia, unspecified: Secondary | ICD-10-CM

## 2016-04-16 DIAGNOSIS — F5105 Insomnia due to other mental disorder: Secondary | ICD-10-CM | POA: Diagnosis not present

## 2016-04-16 DIAGNOSIS — Z23 Encounter for immunization: Secondary | ICD-10-CM | POA: Diagnosis not present

## 2016-04-16 DIAGNOSIS — G43709 Chronic migraine without aura, not intractable, without status migrainosus: Secondary | ICD-10-CM

## 2016-04-16 DIAGNOSIS — F99 Mental disorder, not otherwise specified: Secondary | ICD-10-CM | POA: Diagnosis not present

## 2016-04-16 LAB — COMPREHENSIVE METABOLIC PANEL
ALBUMIN: 4.5 g/dL (ref 3.6–5.1)
ALT: 80 U/L — ABNORMAL HIGH (ref 9–46)
AST: 32 U/L (ref 10–35)
Alkaline Phosphatase: 61 U/L (ref 40–115)
BILIRUBIN TOTAL: 0.6 mg/dL (ref 0.2–1.2)
BUN: 19 mg/dL (ref 7–25)
CALCIUM: 9.4 mg/dL (ref 8.6–10.3)
CHLORIDE: 97 mmol/L — AB (ref 98–110)
CO2: 26 mmol/L (ref 20–31)
CREATININE: 1 mg/dL (ref 0.70–1.33)
Glucose, Bld: 126 mg/dL — ABNORMAL HIGH (ref 65–99)
Potassium: 4.4 mmol/L (ref 3.5–5.3)
Sodium: 134 mmol/L — ABNORMAL LOW (ref 135–146)
TOTAL PROTEIN: 7.4 g/dL (ref 6.1–8.1)

## 2016-04-16 LAB — CBC WITH DIFFERENTIAL/PLATELET
BASOS ABS: 67 {cells}/uL (ref 0–200)
Basophils Relative: 1 %
EOS ABS: 268 {cells}/uL (ref 15–500)
Eosinophils Relative: 4 %
HEMATOCRIT: 48.7 % (ref 38.5–50.0)
HEMOGLOBIN: 16.9 g/dL (ref 13.2–17.1)
LYMPHS ABS: 1474 {cells}/uL (ref 850–3900)
Lymphocytes Relative: 22 %
MCH: 32.3 pg (ref 27.0–33.0)
MCHC: 34.7 g/dL (ref 32.0–36.0)
MCV: 93.1 fL (ref 80.0–100.0)
MONO ABS: 603 {cells}/uL (ref 200–950)
MPV: 9.4 fL (ref 7.5–12.5)
Monocytes Relative: 9 %
NEUTROS ABS: 4288 {cells}/uL (ref 1500–7800)
NEUTROS PCT: 64 %
Platelets: 269 10*3/uL (ref 140–400)
RBC: 5.23 MIL/uL (ref 4.20–5.80)
RDW: 12.5 % (ref 11.0–15.0)
WBC: 6.7 10*3/uL (ref 3.8–10.8)

## 2016-04-16 LAB — LIPID PANEL
CHOLESTEROL: 264 mg/dL — AB (ref 125–200)
HDL: 46 mg/dL (ref >=40)
TRIGLYCERIDES: 520 mg/dL — AB (ref <150)
Total CHOL/HDL Ratio: 5.7 Ratio — ABNORMAL HIGH (ref <=5.0)

## 2016-04-16 MED ORDER — DULOXETINE HCL 60 MG PO CPEP: 120.0000 mg | ORAL_CAPSULE | Freq: Every day | ORAL | 5 refills | Status: DC

## 2016-04-16 MED ORDER — RIZATRIPTAN BENZOATE 10 MG PO TBDP: 10.0000 mg | ORAL_TABLET | ORAL | 5 refills | Status: DC | PRN

## 2016-04-16 NOTE — Progress Notes (Signed)
By signing my name below, I, John Preston, attest that this documentation has been prepared under the direction and in the presence of [name], MD.  Electronically Signed: Verlee Monte, Medical Scribe. 04/16/2016. 11:10 AM.  Subjective:    Patient ID: John Preston, male    DOB: [birth year], 58 y.o.   MRN: [id number]   04/16/2016  Follow-up (labwork , discuss Amitriptyline ) and Medication Refill (chronic med)   HPI  HPI Comments: John Preston is a 60 y.o. male who presents to the [facility] for six month follow-up. Pt had low Vit D 6 months ago, A1c was in the preDM range, his liver test was elevated as well as his cholesterol levels. Repeated liver function test a week later and he was much improved.  HA: Pt has had 1 other HA with photophobia, and phonophobia since retiring. Pt had a 36 hour migraine in July and wanted to take a day off to recuperate and decided to retire when he called in the next day since he was already taking days off. If pt wakes up in the middle of the night with a HA and by the morning it's a migraine. Pt uses Maxalt-MXL PRN, and Cymbalta QD for relief to his symptoms. Pt drinks 2-3 glasses or 2-3 beers a night. Pt is playing disk golf 2-3 days a week and he's trying to get 10,000 steps a day. Pt denies fatigue during the day.  Sleep Disturbance: Pt stopped taking Xanax 1.5 months ago after taking them for 10 years, but not taking it anymore has caused him to loose sleep. Pt will get sleepy around 5-6am in the morning and sleep until noon. Pt has tried melatonin for sleep disturbance but it doesn't help him with his sleep disturbance and causes him to feel foggy in the morning. Pt drinks 1 coffee in the morning. Pt states most sleep aids don't help him sleep. Pt isn't sure if he grinds his teeth at night. Pt did get enough sleep last night. Pt doesn't drink soda or tea.  Depression: Pt states he's doing fine emotionally and his family is  doing good.  Depression screen [facility] 2/9 04/16/2016 10/10/2015 10/03/2015 10/02/2015 03/30/2015  Decreased Interest 0 0 0 0 0  Down, Depressed, Hopeless 0 0 0 0 0  PHQ - 2 Score 0 0 0 0 0    Review of Systems  Constitutional: Negative for activity change, appetite change, chills, diaphoresis, fatigue and fever.  Respiratory: Negative for cough and shortness of breath.   Cardiovascular: Negative for chest pain, palpitations and leg swelling.  Gastrointestinal: Negative for abdominal pain, diarrhea, nausea and vomiting.  Endocrine: Negative for cold intolerance, heat intolerance, polydipsia, polyphagia and polyuria.  Skin: Negative for color change, rash and wound.  Allergic/Immunologic: Negative for environmental allergies and food allergies.  Neurological: Positive for headaches. Negative for dizziness, tremors, seizures, syncope, facial asymmetry, speech difficulty, weakness, light-headedness and numbness.  Psychiatric/Behavioral: Positive for sleep disturbance. Negative for dysphoric mood. The patient is not nervous/anxious.     Past Medical History:  Diagnosis Date  . Anxiety state, unspecified   . Arthritis   . Basal cell carcinoma of skin, site unspecified    L nasal; [facility].  . Depression   . Encounter for therapeutic drug monitoring   . Genital herpes   . Genital herpes, unspecified   . Insomnia, unspecified   . Internal hemorrhoids    colonoscopy 08/04/2008.  [facility].  . Internal hemorrhoids without mention of  complication   . Lumbago   . 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 OSA on CPAP 09/27/2014  . Other specified inflammatory disease of prostate    outlet obstruction with BPH.Urology consult 2006 [facility]  . Other specified viral warts   . Pain in joint, shoulder region   . Pure hypercholesterolemia   . Sleep apnea   . Sleep apnea   . Sleep related headaches 06/22/2014  . Unspecified hearing loss   . Unspecified vitamin D deficiency    Past Surgical History:    Procedure Laterality Date  . COLONOSCOPY  08/04/2008   internal hemorrhoids; repeat in 10 years.  [facility].  [facility] KNEE SURGERY  1991  . L. facial basal cell carcinoma resection  11/2010  . PROSTATE SURGERY  2010   for BPH  . VASECTOMY     No Known Allergies Current Outpatient Prescriptions  Medication Sig Dispense Refill  . acyclovir (ZOVIRAX) 800 MG tablet Take 1 tablet (800 mg total) by mouth 3 (three) times daily. 30 tablet 4  . ALPRAZolam (XANAX) 1 MG tablet Take 1 mg by mouth at bedtime as needed for anxiety. Reported on 10/10/2015    . amitriptyline (ELAVIL) 25 MG tablet Take 1-2 tablets (25-50 mg total) by mouth at bedtime. 180 tablet 1  . DULoxetine (CYMBALTA) 60 MG capsule Take 2 capsules (120 mg total) by mouth daily. 60 capsule 5  . indomethacin (INDOCIN) 25 MG capsule Take 2 capsules (50 mg total) by mouth 2 (two) times daily with a meal. 60 capsule 1  . Multiple Vitamin (MULTIVITAMIN) tablet Take 1 tablet by mouth daily.    . rizatriptan (MAXALT-MLT) 10 MG disintegrating tablet Take 1 tablet (10 mg total) by mouth as needed for migraine. May repeat in 2 hours if needed 8 tablet 5  . simvastatin (ZOCOR) 80 MG tablet Take 0.5 tablets (40 mg total) by mouth at bedtime. 45 tablet 0  . simvastatin (ZOCOR) 40 MG tablet TAKE 1 TABLET BY MOUTH EVERY DAY 90 tablet 1   No current facility-administered medications for this visit.    Social History   Social History  . Marital status: Married    Spouse name: Kieth Brightly  . Number of children: 2  . Years of education: N/A   Occupational History  . Sales    Social History Main Topics  . Smoking status: Never Smoker  . Smokeless tobacco: Never Used  . Alcohol use 9.0 oz/week    15 Glasses of wine per week     Comment: moderate 1-2 glasses of wine per day; more on weekends.  . Drug use: No     Comment: prevoiusly used marijuana  . Sexual activity: Yes   Other Topics Concern  . Not on file   Social History Narrative   Marital status:   Married x 32 years, happily.      Children: two adult children (Chelsea and Theresia Lo); no grandchildren.      Lives: with wife.      Tobacco:  None       Alcohol:   2 glasses of wine per night.  Usually 3 on weekends.  Some beer.      Drugs: none      Exercise: light; job very physically demanding.       Seatbelts:  Always uses seat belts.       Home safety:  Smoke alarm in the home. Carbon monoxide detector in the home.       Guns:  Guns in the  home not stored in locked cabinet.       Caffeine use: 2 servings Coffee per day.   Family History  Problem Relation Age of Onset  . COPD Mother   . Diabetes Mother   . Heart disease Mother 24    cardiac stenting/CAD; no AMI  . Cancer Maternal Grandmother   . Diabetes Maternal Grandmother   . Tics Father   . Dementia Father   . Mental illness Father   . Lung disease      Oxygen dependent       Objective:    There were no vitals taken for this visit. Physical Exam  Constitutional: He is oriented to person, place, and time. He appears well-developed and well-nourished. No distress.  HENT:  Head: Normocephalic and atraumatic.  Eyes: Conjunctivae and EOM are normal. Pupils are equal, round, and reactive to light.  Neck: Normal range of motion. Neck supple. Carotid bruit is not present. No thyromegaly present.  Cardiovascular: Normal rate, regular rhythm, normal heart sounds and intact distal pulses.  Exam reveals no gallop and no friction rub.   No murmur heard. Pulmonary/Chest: Effort normal and breath sounds normal. He has no wheezes. He has no rales.  Lymphadenopathy:    He has no cervical adenopathy.  Neurological: He is alert and oriented to person, place, and time. No cranial nerve deficit.  Skin: Skin is warm and dry. No rash noted. He is not diaphoretic.  Psychiatric: He has a normal mood and affect. His behavior is normal.  Nursing note and vitals reviewed.       Assessment & Plan:   1. Chronic migraine without aura  without status migrainosus, not intractable   2. OSA on CPAP   3. DDD (degenerative disc disease), lumbar   4. Pure hypercholesterolemia   5. Other abnormal glucose   6. Generalized anxiety disorder   7. Insomnia   8. Need for prophylactic vaccination and inoculation against influenza    -congratulations for weaning off of benzo after ten years. -congratulations on early retirement. -obtain labs. -refills provided.   -headaches have improved since retirement.   Orders Placed This Encounter  Procedures  . Flu Vaccine QUAD 36+ mos IM  . CBC with Differential/Platelet  . Comprehensive metabolic panel    Order Specific Question:   Has the patient fasted?    Answer:   Yes  . Lipid panel    Order Specific Question:   Has the patient fasted?    Answer:   Yes  . Hemoglobin A1c   Meds ordered this encounter  Medications  . rizatriptan (MAXALT-MLT) 10 MG disintegrating tablet    Sig: Take 1 tablet (10 mg total) by mouth as needed for migraine. May repeat in 2 hours if needed    Dispense:  8 tablet    Refill:  5  . DULoxetine (CYMBALTA) 60 MG capsule    Sig: Take 2 capsules (120 mg total) by mouth daily.    Dispense:  60 capsule    Refill:  5    Return in about 6 months (around 10/14/2016) for complete physical examiniation.  I personally performed the services described in this documentation, which was scribed in my presence. The recorded information has been reviewed and considered.  Joandry Slagter Elayne Guerin, M.D. [facility] [address] [place], [place]  [address] [phone] phone [phone] fax

## 2016-04-16 NOTE — Patient Instructions (Addendum)
IF you received an x-ray today, you will receive an invoice from [facility]. Please contact [facility] at [phone] with questions or concerns regarding your invoice.   IF you received labwork today, you will receive an invoice from [employer]. Please contact [facility] at [phone] with questions or concerns regarding your invoice.   Our billing staff will not be able to assist you with questions regarding bills from these companies.  You will be contacted with the lab results as soon as they are available. The fastest way to get your results is to activate your My Chart account. Instructions are located on the last page of this paperwork. If you have not heard from [place] regarding the results in 2 weeks, please contact this office.      Recurrent Migraine Headache A migraine headache is an intense, throbbing pain on one or both sides of your head. Recurrent migraines keep coming back. A migraine can last for 30 minutes to several hours. CAUSES  The exact cause of a migraine headache is not always known. However, a migraine may be caused when nerves in the brain become irritated and release chemicals that cause inflammation. This causes pain. Certain things may also trigger migraines, such as:   Alcohol.  Smoking.  Stress.  Menstruation.  Aged cheeses.  Foods or drinks that contain nitrates, glutamate, aspartame, or tyramine.  Lack of sleep.  Chocolate.  Caffeine.  Hunger.  Physical exertion.  Fatigue.  Medicines used to treat chest pain (nitroglycerine), birth control pills, estrogen, and some blood pressure medicines. SYMPTOMS   Pain on one or both sides of your head.  Pulsating or throbbing pain.  Severe pain that prevents daily activities.  Pain that is aggravated by any physical activity.  Nausea, vomiting, or both.  Dizziness.  Pain with exposure to bright lights, loud noises, or  activity.  General sensitivity to bright lights, loud noises, or smells. Before you get a migraine, you may get warning signs that a migraine is coming (aura). An aura may include:  Seeing flashing lights.  Seeing bright spots, halos, or zigzag lines.  Having tunnel vision or blurred vision.  Having feelings of numbness or tingling.  Having trouble talking.  Having muscle weakness. DIAGNOSIS  A recurrent migraine headache is often diagnosed based on:  Symptoms.  Physical examination.  A CT scan or MRI of your head. These imaging tests cannot diagnose migraines but can help rule out other causes of headaches.  TREATMENT  Medicines may be given for pain and nausea. Medicines can also be given to help prevent recurrent migraines. HOME CARE INSTRUCTIONS  Only take over-the-counter or prescription medicines for pain or discomfort as directed by your health care provider. The use of long-term narcotics is not recommended.  Lie down in a dark, quiet room when you have a migraine.  Keep a journal to find out what may trigger your migraine headaches. For example, write down:  What you eat and drink.  How much sleep you get.  Any change to your diet or medicines.  Limit alcohol consumption.  Quit smoking if you smoke.  Get 7-9 hours of sleep, or as recommended by your health care provider.  Limit stress.  Keep lights dim if bright lights bother you and make your migraines worse. SEEK MEDICAL CARE IF:   You do not get relief from the medicines given to you.  You have a recurrence of pain.  You have a fever. [place]  CARE IF:  Your migraine becomes severe.  You have a stiff neck.  You have loss of vision.  You have muscular weakness or loss of muscle control.  You start losing your balance or have trouble walking.  You feel faint or pass out.  You have severe symptoms that are different from your first symptoms. MAKE SURE YOU:   Understand  these instructions.  Will watch your condition.  Will get help right away if you are not doing well or get worse.   This information is not intended to replace advice given to you by your health care provider. Make sure you discuss any questions you have with your health care provider.   Document Released: 04/15/2001 Document Revised: 08/11/2014 Document Reviewed: 03/28/2013 Elsevier Interactive Patient Education [employer].

## 2016-04-17 LAB — HEMOGLOBIN A1C
Hgb A1c MFr Bld: 5.6 % (ref <5.7)
Mean Plasma Glucose: 114 mg/dL

## 2016-05-12 ENCOUNTER — Other Ambulatory Visit: Payer: Self-pay | Admitting: Family Medicine

## 2016-05-13 NOTE — Telephone Encounter (Signed)
017 last ov and labs

## 2016-07-21 ENCOUNTER — Encounter: Payer: Self-pay | Admitting: Family Medicine

## 2016-09-29 ENCOUNTER — Other Ambulatory Visit: Payer: Self-pay | Admitting: *Deleted

## 2016-09-29 DIAGNOSIS — F411 Generalized anxiety disorder: Secondary | ICD-10-CM

## 2016-09-29 MED ORDER — DULOXETINE HCL 60 MG PO CPEP: 120.0000 mg | ORAL_CAPSULE | Freq: Every day | ORAL | 0 refills | Status: DC

## 2016-10-02 ENCOUNTER — Ambulatory Visit: Payer: Self-pay | Admitting: Neurology

## 2016-10-04 ENCOUNTER — Other Ambulatory Visit: Payer: Self-pay

## 2016-10-28 ENCOUNTER — Other Ambulatory Visit: Payer: Self-pay | Admitting: Family Medicine

## 2016-10-30 NOTE — Telephone Encounter (Signed)
Pt needs appt with Dr Tamala Julian in the next month.

## 2016-11-28 ENCOUNTER — Other Ambulatory Visit: Payer: Self-pay | Admitting: Family Medicine

## 2016-11-28 NOTE — Telephone Encounter (Signed)
Call --- I approved a 15 day supply of Simvastatin because patient is overdue for six month follow-up.  His last appointment with me was in 04/2016.  Please schedule OV with me in the upcoming 2 weeks for blood work and to receive refills.

## 2016-11-30 ENCOUNTER — Other Ambulatory Visit: Payer: Self-pay | Admitting: Physician Assistant

## 2016-12-01 NOTE — Telephone Encounter (Signed)
Call-- I approved a 15 day supply of Cymbalta for patient as he is overdue for his six month follow-up with me; last visit was 04/2016.  Please schedule follow-up OV with me in upcoming two weeks. He should be fasting for visit.

## 2017-01-09 ENCOUNTER — Telehealth: Payer: Self-pay | Admitting: Family Medicine

## 2017-01-09 DIAGNOSIS — E7800 Pure hypercholesterolemia, unspecified: Secondary | ICD-10-CM

## 2017-01-09 MED ORDER — SIMVASTATIN 80 MG PO TABS: 40.0000 mg | ORAL_TABLET | Freq: Every day | ORAL | 0 refills | Status: DC

## 2017-01-09 NOTE — Telephone Encounter (Signed)
PATIENT WOULD LIKE DR. Tamala Julian TO KNOW THAT HE HAS ABOUT 10 PILLS LEFT OF HIS SIMVASTATIN (ZOCOR) 40 MG. HE TIRED TO MAKE AN APPOINTMENT WITH DR. Tamala Julian BUT SHE IS GOING ON VACATION FOR 2 WEEKS AND HE WILL ALSO BE GONE THE END OF June. HER APPOINTMENT SCHEDULE AT 104 IS FULL UNTIL THE END OF July. HE WILL CALL FOR AN APPOINTMENT AT 102 FOR Friday July 6th. IN THE MEANTIME, HE NEEDS TO GET ENOUGH MEDICINE TO LAST HIM UNTIL THEN. BEST PHONE [phone] (CELL) [facility] [place] [place]

## 2017-01-10 ENCOUNTER — Other Ambulatory Visit: Payer: Self-pay | Admitting: Family Medicine

## 2017-01-15 IMAGING — CR DG LUMBAR SPINE COMPLETE 4+V
5 series · 5 of 5 positions shown · non-contrast
Comparison: None.

CLINICAL DATA: This am at work he was picking up a box of wire- he
does this daily. He picked it up and turned to walk and put the box
on a shelf- his back suddenly gave him severe pain. He fell to the
ground and was in severe pain.

EXAM:
LUMBAR SPINE - COMPLETE 4+ VIEW

[AP]
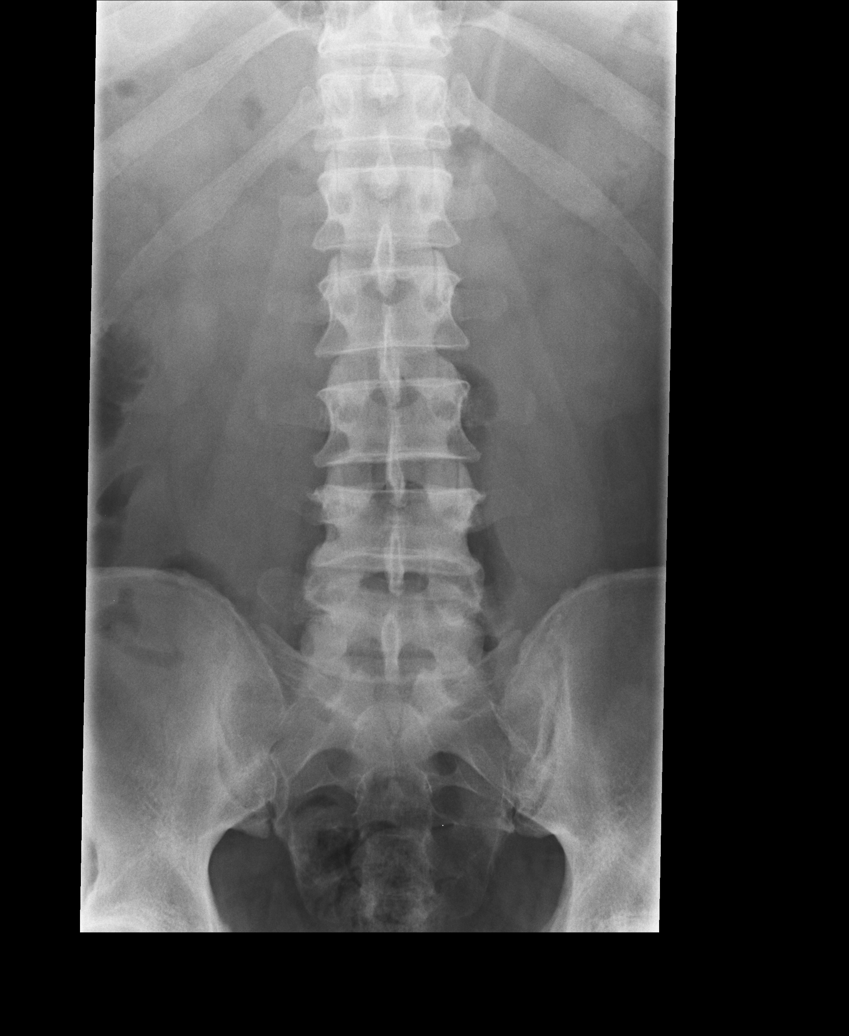

[rpo]
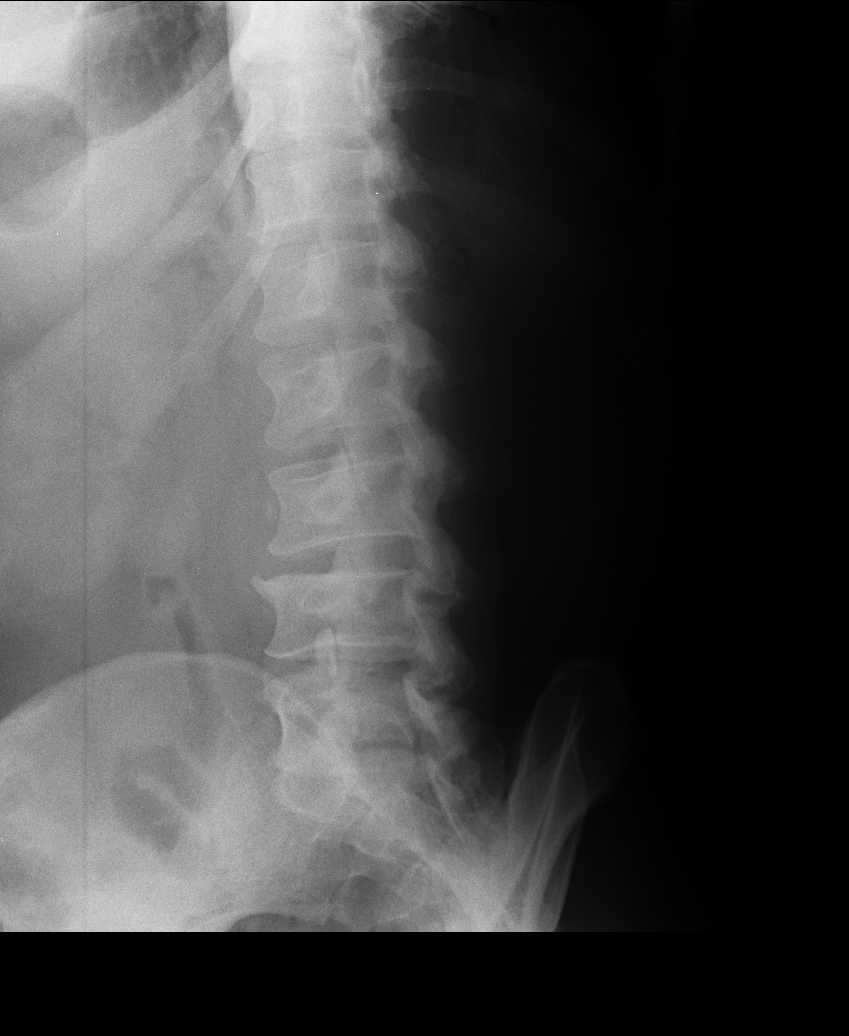

[lpo]
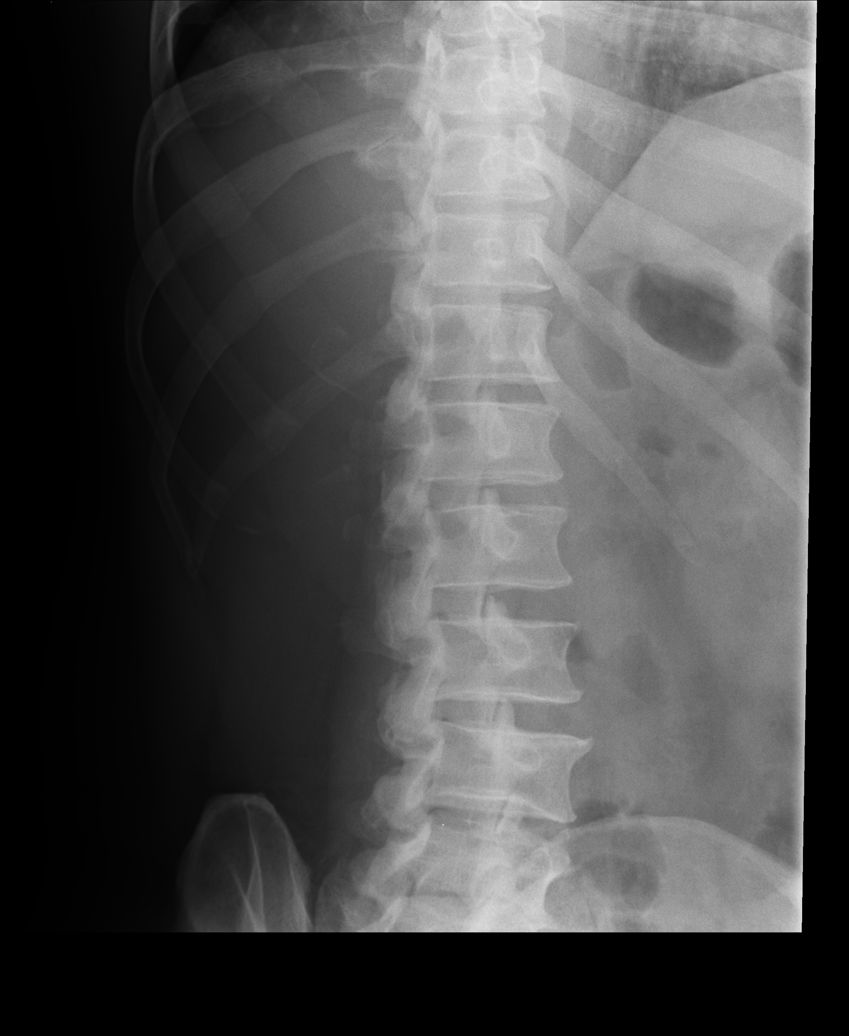

[lateral]
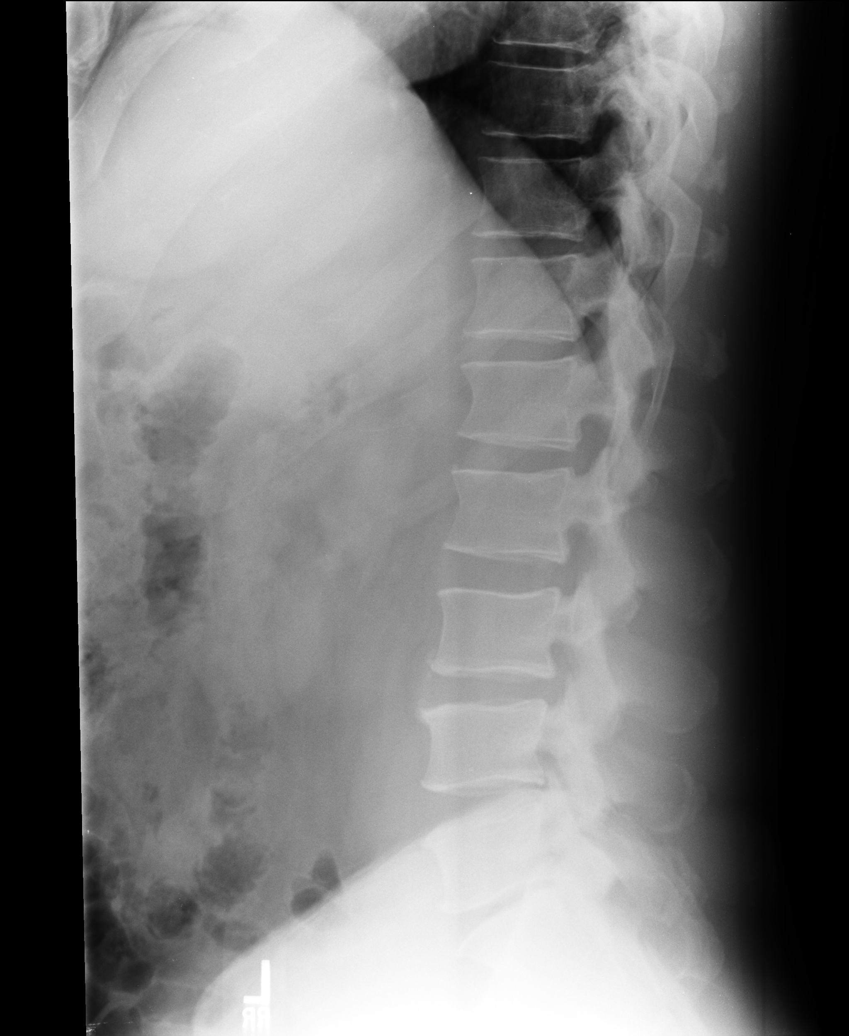

[l5 s1]
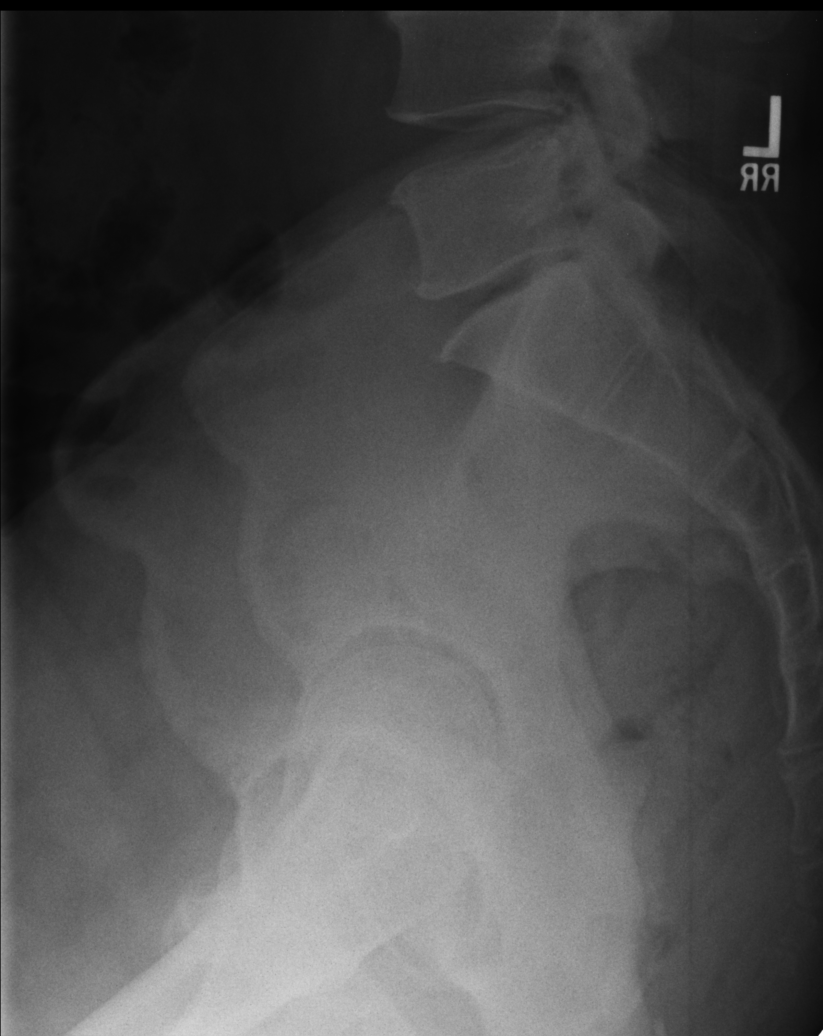

[5 of 5 positions shown; findings below may reference images not displayed]

FINDINGS: No fracture.  No spondylolisthesis.

Mild loss of disc height at L4-L5 with moderate loss of disc height
at L5-S1. Small endplate osteophytes noted at L3-L4 through L5-S1.

Facet joints are well preserved as are the remaining disc spaces.

Soft tissues are unremarkable.
IMPRESSION: Disc degenerative changes at L4-L5 and L5-S1. No fracture or acute
finding.

## 2017-02-13 ENCOUNTER — Ambulatory Visit (INDEPENDENT_AMBULATORY_CARE_PROVIDER_SITE_OTHER): Payer: BLUE CROSS/BLUE SHIELD | Admitting: Family Medicine

## 2017-02-13 ENCOUNTER — Encounter: Payer: Self-pay | Admitting: Family Medicine

## 2017-02-13 VITALS — BP 130/90 | HR 98 | Temp 98.6°F | Resp 18 | Ht 68.9 in | Wt 213.0 lb

## 2017-02-13 DIAGNOSIS — Z9989 Dependence on other enabling machines and devices: Secondary | ICD-10-CM

## 2017-02-13 DIAGNOSIS — R9431 Abnormal electrocardiogram [ECG] [EKG]: Secondary | ICD-10-CM

## 2017-02-13 DIAGNOSIS — Z125 Encounter for screening for malignant neoplasm of prostate: Secondary | ICD-10-CM | POA: Diagnosis not present

## 2017-02-13 DIAGNOSIS — R7309 Other abnormal glucose: Secondary | ICD-10-CM | POA: Diagnosis not present

## 2017-02-13 DIAGNOSIS — G43709 Chronic migraine without aura, not intractable, without status migrainosus: Secondary | ICD-10-CM

## 2017-02-13 DIAGNOSIS — F5105 Insomnia due to other mental disorder: Secondary | ICD-10-CM | POA: Diagnosis not present

## 2017-02-13 DIAGNOSIS — F411 Generalized anxiety disorder: Secondary | ICD-10-CM | POA: Diagnosis not present

## 2017-02-13 DIAGNOSIS — E7800 Pure hypercholesterolemia, unspecified: Secondary | ICD-10-CM | POA: Diagnosis not present

## 2017-02-13 DIAGNOSIS — G4733 Obstructive sleep apnea (adult) (pediatric): Secondary | ICD-10-CM | POA: Diagnosis not present

## 2017-02-13 DIAGNOSIS — N5202 Corporo-venous occlusive erectile dysfunction: Secondary | ICD-10-CM

## 2017-02-13 DIAGNOSIS — M5136 Other intervertebral disc degeneration, lumbar region: Secondary | ICD-10-CM | POA: Diagnosis not present

## 2017-02-13 DIAGNOSIS — Z0000 Encounter for general adult medical examination without abnormal findings: Secondary | ICD-10-CM

## 2017-02-13 DIAGNOSIS — F99 Mental disorder, not otherwise specified: Secondary | ICD-10-CM

## 2017-02-13 LAB — POCT URINALYSIS DIP (MANUAL ENTRY)
Bilirubin, UA: NEGATIVE
GLUCOSE UA: NEGATIVE mg/dL
Ketones, POC UA: NEGATIVE mg/dL
LEUKOCYTES UA: NEGATIVE
NITRITE UA: NEGATIVE
PH UA: 6 (ref 5.0–8.0)
Protein Ur, POC: 30 mg/dL — AB
RBC UA: NEGATIVE
Spec Grav, UA: >=1.03 — AB (ref 1.010–1.025)
UROBILINOGEN UA: 0.2 U/dL

## 2017-02-13 MED ORDER — ALPRAZOLAM 0.5 MG PO TABS: 0.5000 mg | ORAL_TABLET | Freq: Every evening | ORAL | 2 refills | Status: DC | PRN

## 2017-02-13 MED ORDER — SILDENAFIL CITRATE 100 MG PO TABS: 100.0000 mg | ORAL_TABLET | Freq: Every day | ORAL | 5 refills | Status: DC | PRN

## 2017-02-13 MED ORDER — ATORVASTATIN CALCIUM 20 MG PO TABS: 20.0000 mg | ORAL_TABLET | Freq: Every day | ORAL | 1 refills | Status: DC

## 2017-02-13 MED ORDER — ACYCLOVIR 800 MG PO TABS: 800.0000 mg | ORAL_TABLET | Freq: Three times a day (TID) | ORAL | 4 refills | Status: DC

## 2017-02-13 MED ORDER — DULOXETINE HCL 60 MG PO CPEP: 60.0000 mg | ORAL_CAPSULE | Freq: Every day | ORAL | 1 refills | Status: DC

## 2017-02-13 MED ORDER — INDOMETHACIN 25 MG PO CAPS: 50.0000 mg | ORAL_CAPSULE | Freq: Two times a day (BID) | ORAL | 1 refills | Status: DC

## 2017-02-13 NOTE — Patient Instructions (Addendum)
   IF you received an x-ray today, you will receive an invoice from [facility]. Please contact [facility] at [phone] with questions or concerns regarding your invoice.   IF you received labwork today, you will receive an invoice from [facility]. Please contact [facility] at [phone] with questions or concerns regarding your invoice.   Our billing staff will not be able to assist you with questions regarding bills from these companies.  You will be contacted with the lab results as soon as they are available. The fastest way to get your results is to activate your My Chart account. Instructions are located on the last page of this paperwork. If you have not heard from us regarding the results in 2 weeks, please contact this office.      Preventive Care 40-64 Years, Male Preventive care refers to lifestyle choices and visits with your health care provider that can promote health and wellness. What does preventive care include?  A yearly physical exam. This is also called an annual well check.  Dental exams once or twice a year.  Routine eye exams. Ask your health care provider how often you should have your eyes checked.  Personal lifestyle choices, including: ? Daily care of your teeth and gums. ? Regular physical activity. ? Eating a healthy diet. ? Avoiding tobacco and drug use. ? Limiting alcohol use. ? Practicing safe sex. ? Taking low-dose aspirin every day starting at age 50. What happens during an annual well check? The services and screenings done by your health care provider during your annual well check will depend on your age, overall health, lifestyle risk factors, and family history of disease. Counseling Your health care provider may ask you questions about your:  Alcohol use.  Tobacco use.  Drug use.  Emotional well-being.  Home and relationship well-being.  Sexual activity.  Eating habits.  Work and work  environment.  Screening You may have the following tests or measurements:  Height, weight, and BMI.  Blood pressure.  Lipid and cholesterol levels. These may be checked every 5 years, or more frequently if you are over 50 years old.  Skin check.  Lung cancer screening. You may have this screening every year starting at age 55 if you have a 30-pack-year history of smoking and currently smoke or have quit within the past 15 years.  Fecal occult blood test (FOBT) of the stool. You may have this test every year starting at age 50.  Flexible sigmoidoscopy or colonoscopy. You may have a sigmoidoscopy every 5 years or a colonoscopy every 10 years starting at age 50.  Prostate cancer screening. Recommendations will vary depending on your family history and other risks.  Hepatitis C blood test.  Hepatitis B blood test.  Sexually transmitted disease (STD) testing.  Diabetes screening. This is done by checking your blood sugar (glucose) after you have not eaten for a while (fasting). You may have this done every 1-3 years.  Discuss your test results, treatment options, and if necessary, the need for more tests with your health care provider. Vaccines Your health care provider may recommend certain vaccines, such as:  Influenza vaccine. This is recommended every year.  Tetanus, diphtheria, and acellular pertussis (Tdap, Td) vaccine. You may need a Td booster every 10 years.  Varicella vaccine. You may need this if you have not been vaccinated.  Zoster vaccine. You may need this after age 60.  Measles, mumps, and rubella (MMR) vaccine. You may need at least one dose   of MMR if you were born in 1957 or later. You may also need a second dose.  Pneumococcal 13-valent conjugate (PCV13) vaccine. You may need this if you have certain conditions and have not been vaccinated.  Pneumococcal polysaccharide (PPSV23) vaccine. You may need one or two doses if you smoke cigarettes or if you have  certain conditions.  Meningococcal vaccine. You may need this if you have certain conditions.  Hepatitis A vaccine. You may need this if you have certain conditions or if you travel or work in places where you may be exposed to hepatitis A.  Hepatitis B vaccine. You may need this if you have certain conditions or if you travel or work in places where you may be exposed to hepatitis B.  Haemophilus influenzae type b (Hib) vaccine. You may need this if you have certain risk factors.  Talk to your health care provider about which screenings and vaccines you need and how often you need them. This information is not intended to replace advice given to you by your health care provider. Make sure you discuss any questions you have with your health care provider. Document Released: 08/17/2015 Document Revised: 04/09/2016 Document Reviewed: 05/22/2015 Elsevier Interactive Patient Education  2017 Elsevier Inc.  

## 2017-02-13 NOTE — Progress Notes (Signed)
Subjective:    Patient ID: John Preston, male    DOB: 09-Nov-1955, 61 y.o.   MRN: [id number]  02/13/2017  Annual Exam   HPI This 61 y.o. male presents for evaluation Complete Physical Examination.  Last physical: 10-2015 Colonoscopy:  2010 PSA: 2017 Eye exam:  Several years ago; readers Dental exam:   Visual Acuity Screening   Right eye Left eye Both eyes  Without correction: 20/20 20/20 20/20   With correction:       BP Readings from Last 3 Encounters:  02/13/17 130/90  10/10/15 130/82  10/03/15 126/90   Wt Readings from Last 3 Encounters:  02/13/17 213 lb (96.6 kg)  10/10/15 208 lb 9.6 oz (94.6 kg)  10/03/15 208 lb 12.8 oz (94.7 kg)   Immunization History  Administered Date(s) Administered  . Influenza Split 06/28/2012  . Influenza,inj,Quad PF,36+ Mos 09/05/2013, 04/27/2014, 04/25/2015, 04/16/2016  . Influenza-Unspecified 06/13/2010, 06/16/2011  . Tdap 06/16/2011   Anxiety/depression:  With increase of Cymbalta developed ED.  Pt suffering   Migraines/headaches: much improved since retiring.  Has had two serious headaches since retiring one year ago.  One of those was one week ago.  Had to use Maxalt with good results.  Not sleeping well.  Has prescribed Xanax and Valium.  OSA: does not wear CPAP every night.  Full face mask; so uncomfortable.   Review of Systems  Constitutional: Negative for activity change, appetite change, chills, diaphoresis, fatigue, fever and unexpected weight change.  HENT: Negative for congestion, dental problem, drooling, ear discharge, ear pain, facial swelling, hearing loss, mouth sores, nosebleeds, postnasal drip, rhinorrhea, sinus pressure, sneezing, sore throat, tinnitus, trouble swallowing and voice change.   Eyes: Negative for photophobia, pain, discharge, redness, itching and visual disturbance.  Respiratory: Negative for apnea, cough, choking, chest tightness, shortness of breath, wheezing and stridor.   Cardiovascular: Negative  for chest pain, palpitations and leg swelling.  Gastrointestinal: Negative for abdominal pain, blood in stool, constipation, diarrhea, nausea and vomiting.  Endocrine: Negative for cold intolerance, heat intolerance, polydipsia, polyphagia and polyuria.  Genitourinary: Negative for decreased urine volume, difficulty urinating, discharge, dysuria, enuresis, flank pain, frequency, genital sores, hematuria, penile pain, penile swelling, scrotal swelling, testicular pain and urgency.       Nocturia x 1-2.  Urinary stream is weaker.    Musculoskeletal: Negative for arthralgias, back pain, gait problem, joint swelling, myalgias, neck pain and neck stiffness.  Skin: Negative for color change, pallor, rash and wound.  Allergic/Immunologic: Negative for environmental allergies, food allergies and immunocompromised state.  Neurological: Positive for headaches. Negative for dizziness, tremors, seizures, syncope, facial asymmetry, speech difficulty, weakness, light-headedness and numbness.  Hematological: Negative for adenopathy. Does not bruise/bleed easily.  Psychiatric/Behavioral: Positive for sleep disturbance. Negative for agitation, behavioral problems, confusion, decreased concentration, dysphoric mood, hallucinations, self-injury and suicidal ideas. The patient is nervous/anxious. The patient is not hyperactive.     Past Medical History:  Diagnosis Date  . Anxiety state, unspecified   . Arthritis   . Basal cell carcinoma of skin, site unspecified    L nasal; Draos.  . Depression   . Encounter for therapeutic drug monitoring   . Genital herpes   . Genital herpes, unspecified   . Insomnia, unspecified   . Internal hemorrhoids    colonoscopy 08/04/2008.  [facility].  . Internal hemorrhoids without mention of complication   . Lumbago   . 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 OSA on CPAP 09/27/2014  . 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 Other specified viral warts   . Pain in joint, shoulder region   . Pure hypercholesterolemia   . Sleep apnea   . Sleep apnea   . Sleep related headaches 06/22/2014  . Unspecified hearing loss   . Unspecified vitamin D deficiency    Past Surgical History:  Procedure Laterality Date  . COLONOSCOPY  08/04/2008   internal hemorrhoids; repeat in 10 years.  [facility].  Marland Kitchen KNEE SURGERY  1991  . L. facial basal cell carcinoma resection  11/2010  . PROSTATE SURGERY  2010   for BPH  . VASECTOMY     No Known Allergies  Social History   Social History  . Marital status: Married    Spouse name: Kieth Brightly  . Number of children: 2  . Years of education: N/A   Occupational History  . Sales    Social History Main Topics  . Smoking status: Never Smoker  . Smokeless tobacco: Never Used  . Alcohol use 9.0 oz/week    15 Glasses of wine per week     Comment: moderate 1-2 glasses of wine per day; more on weekends.  . Drug use: No     Comment: prevoiusly used marijuana  . Sexual activity: Yes    Birth control/ protection: Post-menopausal   Other Topics Concern  . Not on file   Social History Narrative   Marital status:  Married x 33 years, happily.      Children: two adult children (Chelsea and Theresia Lo); no grandchildren.      Lives: with wife.      Employment:  Retired in 2018.      Tobacco:  None       Alcohol:   2-3 glasses of beer per night.  Usually 3 on weekends.      Drugs: none      Exercise:none in 2018.  NO yard work due to lower back pain.      Seatbelts:  Always uses seat belts.  No texting while driving.       Home safety:  Smoke alarm in the home. Carbon monoxide detector in the home.       Guns:  Guns in the home not stored in locked cabinet.       Caffeine use: 2 servings Coffee per day.   Family History  Problem Relation Age of Onset  . COPD Mother   . Diabetes Mother   . Heart disease Mother 67       cardiac stenting/CAD; no AMI  . Cancer  Maternal Grandmother   . Diabetes Maternal Grandmother   . Tics Father   . Dementia Father   . Mental illness Father   . Lung disease Unknown        Oxygen dependent       Objective:    BP 130/90   Pulse 98   Temp 98.6 F (37 C) (Oral)   Resp 18   Ht 5' 8.9" (1.75 m)   Wt 213 lb (96.6 kg)   SpO2 99%   BMI 31.55 kg/m  Physical Exam  Constitutional: He is oriented to person, place, and time. He appears well-developed and well-nourished. No distress.  HENT:  Head: Normocephalic and atraumatic.  Right Ear: External ear normal.  Left Ear: External ear normal.  Nose: Nose normal.  Mouth/Throat: Oropharynx is clear and moist.  Eyes: Pupils are equal, round, and reactive to light. Conjunctivae and EOM are normal.  Neck: Normal range of motion. Neck supple. Carotid bruit is not present. No thyromegaly present.  Cardiovascular: Normal rate, regular rhythm, normal heart sounds and intact distal pulses.  Exam reveals no gallop and no friction rub.   No murmur heard. Pulmonary/Chest: Effort normal and breath sounds normal. He has no wheezes. He has no rales.  Abdominal: Soft. Bowel sounds are normal. He exhibits no distension and no mass. There is no tenderness. There is no rebound and no guarding.  Genitourinary: Rectum normal, prostate normal and penis normal.  Lymphadenopathy:    He has no cervical adenopathy.  Neurological: He is alert and oriented to person, place, and time. No cranial nerve deficit.  Skin: Skin is warm and dry. No rash noted. He is not diaphoretic.  Psychiatric: He has a normal mood and affect. His behavior is normal. Judgment and thought content normal.  Nursing note and vitals reviewed.   Depression screen [facility] 2/9 02/13/2017 04/16/2016 10/10/2015 10/03/2015 10/02/2015  Decreased Interest 0 0 0 0 0  Down, Depressed, Hopeless 0 0 0 0 0  PHQ - 2 Score 0 0 0 0 0   Fall Risk  02/13/2017 04/16/2016 10/02/2015 02/20/2015 09/07/2013  Falls in the past year? No No No No No        Assessment & Plan:   1. Routine physical examination   2. Chronic migraine without aura without status migrainosus, not intractable   3. OSA on CPAP   4. DDD (degenerative disc disease), lumbar   5. Generalized anxiety disorder   6. Insomnia due to other mental disorder   7. Other abnormal glucose   8. Pure hypercholesterolemia   9. Screening for prostate cancer   10. Corporo-venous occlusive erectile dysfunction   11. Nonspecific abnormal electrocardiogram (ECG) (EKG)    -anticipatory guidance provided --- exercise, weight loss, safe driving practices, aspirin 81mg  daily. -obtain age appropriate screening labs and labs for chronic disease management. -pt with abnormal EKG; recommend referral to cardiology for risk stratification; pt declined referral.   -pt complaining of ED yet with abnormal EKG; pt declined referral to cardiology and agrees NOT to take Viagra or similar products. -pt insisting on Xanax rx for insomnia despite risk of Alzheimers.  Pt agreeable to PRN use.  Also advised that alcohol use disrupts sleep cycle.  Pt expressed understanding.   Orders Placed This Encounter  Procedures  . CBC with Differential/Platelet  . Comprehensive metabolic panel    Order Specific Question:   Has the patient fasted?    Answer:   Yes  . Hemoglobin A1c  . Lipid panel    Order Specific Question:   Has the patient fasted?    Answer:   Yes  . TSH  . PSA  . POCT urinalysis dipstick  . EKG 12-Lead   Meds ordered this encounter  Medications  . ALPRAZolam (XANAX) 0.5 MG tablet    Sig: Take 1 tablet (0.5 mg total) by mouth at bedtime as needed for anxiety.    Dispense:  30 tablet    Refill:  2  . atorvastatin (LIPITOR) 20 MG tablet    Sig: Take 1 tablet (20 mg total) by mouth daily.    Dispense:  90 tablet    Refill:  1  . acyclovir (ZOVIRAX) 800 MG tablet    Sig: Take 1 tablet (800 mg total) by mouth 3 (three) times daily.    Dispense:  30 tablet    Refill:  4  .  DULoxetine (CYMBALTA) 60 MG  capsule    Sig: Take 1 capsule (60 mg total) by mouth daily.    Dispense:  90 capsule    Refill:  1  . indomethacin (INDOCIN) 25 MG capsule    Sig: Take 2 capsules (50 mg total) by mouth 2 (two) times daily with a meal.    Dispense:  60 capsule    Refill:  1  . sildenafil (VIAGRA) 100 MG tablet    Sig: Take 1 tablet (100 mg total) by mouth daily as needed for erectile dysfunction.    Dispense:  8 tablet    Refill:  5    Return in about 6 months (around 08/16/2017) for recheck cholestero, migraines, anxiety.   Tremar Wickens Elayne Guerin, M.D. Primary Care at [facility] [place] [address] [place], [place]  [address] [phone] phone [phone] fax

## 2017-02-14 LAB — CBC WITH DIFFERENTIAL/PLATELET
Basophils Absolute: 0 10*3/uL (ref 0.0–0.2)
Basos: 1 %
EOS (ABSOLUTE): 0.2 10*3/uL (ref 0.0–0.4)
Eos: 3 %
Hematocrit: 44.2 % (ref 37.5–51.0)
Hemoglobin: 15.4 g/dL (ref 13.0–17.7)
IMMATURE GRANULOCYTES: 0 %
Immature Grans (Abs): 0 10*3/uL (ref 0.0–0.1)
LYMPHS: 34 %
Lymphocytes Absolute: 1.7 10*3/uL (ref 0.7–3.1)
MCH: 32.3 pg (ref 26.6–33.0)
MCHC: 34.8 g/dL (ref 31.5–35.7)
MCV: 93 fL (ref 79–97)
MONOS ABS: 0.4 10*3/uL (ref 0.1–0.9)
Monocytes: 8 %
NEUTROS PCT: 54 %
Neutrophils Absolute: 2.6 10*3/uL (ref 1.4–7.0)
PLATELETS: 220 10*3/uL (ref 150–379)
RBC: 4.77 x10E6/uL (ref 4.14–5.80)
RDW: 13.3 % (ref 12.3–15.4)
WBC: 4.8 10*3/uL (ref 3.4–10.8)

## 2017-02-14 LAB — HEMOGLOBIN A1C
ESTIMATED AVERAGE GLUCOSE: 131 mg/dL
Hgb A1c MFr Bld: 6.2 % — ABNORMAL HIGH (ref 4.8–5.6)

## 2017-02-14 LAB — COMPREHENSIVE METABOLIC PANEL
ALT: 57 IU/L — ABNORMAL HIGH (ref 0–44)
AST: 30 IU/L (ref 0–40)
Albumin/Globulin Ratio: 2 (ref 1.2–2.2)
Albumin: 4.5 g/dL (ref 3.5–5.5)
Alkaline Phosphatase: 64 IU/L (ref 39–117)
BUN/Creatinine Ratio: 11 (ref 9–20)
BUN: 12 mg/dL (ref 6–24)
Bilirubin Total: 0.4 mg/dL (ref 0.0–1.2)
CALCIUM: 9.1 mg/dL (ref 8.7–10.2)
CO2: 24 mmol/L (ref 20–29)
CREATININE: 1.08 mg/dL (ref 0.76–1.27)
Chloride: 100 mmol/L (ref 96–106)
GFR calc Af Amer: 87 mL/min/{1.73_m2} (ref >59)
GFR, EST NON AFRICAN AMERICAN: 75 mL/min/{1.73_m2} (ref >59)
GLUCOSE: 124 mg/dL — AB (ref 65–99)
Globulin, Total: 2.3 g/dL (ref 1.5–4.5)
POTASSIUM: 3.8 mmol/L (ref 3.5–5.2)
Sodium: 140 mmol/L (ref 134–144)
Total Protein: 6.8 g/dL (ref 6.0–8.5)

## 2017-02-14 LAB — LIPID PANEL
CHOLESTEROL TOTAL: 213 mg/dL — AB (ref 100–199)
Chol/HDL Ratio: 5 ratio (ref 0.0–5.0)
HDL: 43 mg/dL (ref >39)
LDL Calculated: 108 mg/dL — ABNORMAL HIGH (ref 0–99)
Triglycerides: 308 mg/dL — ABNORMAL HIGH (ref 0–149)
VLDL CHOLESTEROL CAL: 62 mg/dL — AB (ref 5–40)

## 2017-02-14 LAB — TSH: TSH: 4.41 u[IU]/mL (ref 0.450–4.500)

## 2017-02-14 LAB — PSA: Prostate Specific Ag, Serum: 0.6 ng/mL (ref 0.0–4.0)

## 2017-03-01 DIAGNOSIS — R9431 Abnormal electrocardiogram [ECG] [EKG]: Secondary | ICD-10-CM | POA: Insufficient documentation

## 2017-03-01 DIAGNOSIS — N5202 Corporo-venous occlusive erectile dysfunction: Secondary | ICD-10-CM | POA: Insufficient documentation

## 2017-03-09 ENCOUNTER — Telehealth: Payer: Self-pay | Admitting: Family Medicine

## 2017-03-09 DIAGNOSIS — R9431 Abnormal electrocardiogram [ECG] [EKG]: Secondary | ICD-10-CM

## 2017-03-09 DIAGNOSIS — E7800 Pure hypercholesterolemia, unspecified: Secondary | ICD-10-CM

## 2017-03-09 NOTE — Telephone Encounter (Signed)
Pt's wife called in regards to cardiology referral. She said they were told they would have a referral before Dr. Tamala Julian went on vacation due to unusual results on EKG. They are wanting to contact the cardiologist. I do not see cardiology referral placed since March of 2017. Please advise. Pt can be contacted at [phone].

## 2017-03-10 NOTE — Telephone Encounter (Signed)
Referral sent. Thank you!

## 2017-03-10 NOTE — Telephone Encounter (Signed)
During patient's visit, I advised him of abnormal EKG and my recommendation to undergo cardiology evaluation.  He declined the cardiology referral during the visit.  I have placed the referral now.

## 2017-03-17 ENCOUNTER — Encounter: Payer: Self-pay | Admitting: Cardiology

## 2017-03-17 ENCOUNTER — Ambulatory Visit (INDEPENDENT_AMBULATORY_CARE_PROVIDER_SITE_OTHER): Payer: BLUE CROSS/BLUE SHIELD | Admitting: Cardiology

## 2017-03-17 VITALS — BP 118/68 | HR 76 | Resp 10 | Ht 68.9 in | Wt 207.1 lb

## 2017-03-17 DIAGNOSIS — E785 Hyperlipidemia, unspecified: Secondary | ICD-10-CM

## 2017-03-17 DIAGNOSIS — R9431 Abnormal electrocardiogram [ECG] [EKG]: Secondary | ICD-10-CM | POA: Diagnosis not present

## 2017-03-17 MED ORDER — ASPIRIN EC 81 MG PO TBEC: 162.0000 mg | DELAYED_RELEASE_TABLET | Freq: Every day | ORAL | 3 refills | Status: DC

## 2017-03-17 NOTE — Progress Notes (Signed)
Cardiology Office Note:    Date:  03/17/2017   ID:  John Preston, DOB [birth year], MRN [id number]  PCP:  John Honour, MD  Cardiologist:  Jenean Lindau, MD   Referring MD: John Honour, MD    ASSESSMENT:    1. Abnormal electrocardiogram (ECG) (EKG)   2. Dyslipidemia    PLAN:    In order of problems listed above:  1. I discussed my findings with the patient at extensive length. In view of the above concerns and the fact that he has risk factors for coronary artery disease I mentioned to him that I would be happy for him to undergo exercise stress echo. He is agreeable with that. Also in view of his abnormal blood glucose at told him to take to coated baby aspirin sooner daily basis. His lipids will be followed by his primary care physician. I discussed lipids with him at extensive length. His triglycerides are also markedly elevated but he seems to be following an appropriate diet and will get it checked in the near future his primary care provider. He will be seen in follow-up appointment in 6 months or earlier if he has any concerns. 2. I thank Dr. Tamala Julian for the courtesy of this consultation and we will keep you posted about the results of the aforementioned test.   Medication Adjustments/Labs and Tests Ordered: Current medicines are reviewed at length with the patient today.  Concerns regarding medicines are outlined above.  No orders of the defined types were placed in this encounter.  No orders of the defined types were placed in this encounter.    History of Present Illness:    John Preston is a 61 y.o. male who is being seen today for the evaluation of Abnormal EKG at the request of John Honour, MD. Patient is a pleasant 61 year old male. He is previously unknown to me. He has history of dyslipidemia and recently his blood work reveals elevated blood sugars and he is here to get counseled by his primary care physician for the same. He leads an active lifestyle but  does not exercise on a regular basis. He mentions to me that that time. His blood work was done he was pretty noncompliant with this type just come back from a vacation. He mentions to me that his current on his carbohydrate intake significantly and has lost 15 pounds in the past couple of weeks. At the time of my evaluation is alert awake oriented and in no distress. He denies any chest pain orthopnea or PND. Again in light of risk factors and abnormal EKG he is referred here.  Past Medical History:  Diagnosis Date  . Anxiety state, unspecified   . Arthritis   . Basal cell carcinoma of skin, site unspecified    L nasal; Draos.  . Depression   . Encounter for therapeutic drug monitoring   . Genital herpes   . Genital herpes, unspecified   . Insomnia, unspecified   . Internal hemorrhoids    colonoscopy 08/04/2008.  [facility].  . Internal hemorrhoids without mention of complication   . Lumbago   . 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 OSA on CPAP 09/27/2014  . Other specified inflammatory disease of prostate    outlet obstruction with BPH.Urology consult 2006 [facility]  . Other specified viral warts   . Pain in joint, shoulder region   . Pure hypercholesterolemia   . Sleep apnea   . Sleep apnea   . Sleep related headaches 06/22/2014  .  Unspecified hearing loss   . Unspecified vitamin D deficiency     Past Surgical History:  Procedure Laterality Date  . COLONOSCOPY  08/04/2008   internal hemorrhoids; repeat in 10 years.  [facility].  Marland Kitchen KNEE SURGERY  1991  . L. facial basal cell carcinoma resection  11/2010  . PROSTATE SURGERY  2010   for BPH  . VASECTOMY      Current Medications: Current Meds  Medication Sig  . acyclovir (ZOVIRAX) 800 MG tablet Take 1 tablet (800 mg total) by mouth 3 (three) times daily.  Marland Kitchen ALPRAZolam (XANAX) 0.5 MG tablet Take 1 tablet (0.5 mg total) by mouth at bedtime as needed for anxiety.  Marland Kitchen atorvastatin (LIPITOR) 20 MG tablet Take 1 tablet (20 mg  total) by mouth daily.  . DULoxetine (CYMBALTA) 60 MG capsule Take 1 capsule (60 mg total) by mouth daily.  . indomethacin (INDOCIN) 25 MG capsule Take 2 capsules (50 mg total) by mouth 2 (two) times daily with a meal.  . rizatriptan (MAXALT-MLT) 10 MG disintegrating tablet Take 1 tablet (10 mg total) by mouth as needed for migraine. May repeat in 2 hours if needed     Allergies:   Patient has no known allergies.   Social History   Social History  . Marital status: Married    Spouse name: Kieth Brightly  . Number of children: 2  . Years of education: N/A   Occupational History  . Sales    Social History Main Topics  . Smoking status: Never Smoker  . Smokeless tobacco: Never Used  . Alcohol use 9.0 oz/week    15 Glasses of wine per week     Comment: moderate 1-2 glasses of wine per day; more on weekends.  . Drug use: No     Comment: prevoiusly used marijuana  . Sexual activity: Yes    Birth control/ protection: Post-menopausal   Other Topics Concern  . None   Social History Narrative   Marital status:  Married x 33 years, happily.      Children: two adult children (Chelsea and Theresia Lo); no grandchildren.      Lives: with wife.      Employment:  Retired in 2018.      Tobacco:  None       Alcohol:   2-3 glasses of beer per night.  Usually 3 on weekends.      Drugs: none      Exercise:none in 2018.  NO yard work due to lower back pain.      Seatbelts:  Always uses seat belts.  No texting while driving.       Home safety:  Smoke alarm in the home. Carbon monoxide detector in the home.       Guns:  Guns in the home not stored in locked cabinet.       Caffeine use: 2 servings Coffee per day.     Family History: The patient's family history includes COPD in his mother; Cancer in his maternal grandmother; Dementia in his father; Diabetes in his maternal grandmother and mother; Heart disease (age of onset: 59) in his mother; Lung disease in his unknown relative; Mental illness in his  father; Tics in his father.  ROS:   Please see the history of present illness.    All other systems reviewed and are negative.  EKGs/Labs/Other Studies Reviewed:    The following studies were reviewed today: I reviewed records from primary care physician's office extensively. Questions were answered to his  and his wife's satisfaction.   Recent Labs: 02/13/2017: ALT 57; BUN 12; Creatinine, Ser 1.08; Hemoglobin 15.4; Platelets 220; Potassium 3.8; Sodium 140; TSH 4.410  Recent Lipid Panel    Component Value Date/Time   CHOL 213 (H) 02/13/2017 1013   TRIG 308 (H) 02/13/2017 1013   HDL 43 02/13/2017 1013   CHOLHDL 5.0 02/13/2017 1013   CHOLHDL 5.7 (H) 04/16/2016 1122   VLDL NOT CALC 04/16/2016 1122   LDLCALC 108 (H) 02/13/2017 1013    Physical Exam:    VS:  BP 118/68   Pulse 76   Resp 10   Ht 5' 8.9" (1.75 m)   Wt 207 lb 1.9 oz (93.9 kg)   BMI 30.68 kg/m     Wt Readings from Last 3 Encounters:  03/17/17 207 lb 1.9 oz (93.9 kg)  02/13/17 213 lb (96.6 kg)  10/10/15 208 lb 9.6 oz (94.6 kg)     GEN: Patient is in no acute distress HEENT: Normal NECK: No JVD; No carotid bruits LYMPHATICS: No lymphadenopathy CARDIAC: S1 S2 regular, 2/6 systolic murmur at the apex. RESPIRATORY:  Clear to auscultation without rales, wheezing or rhonchi  ABDOMEN: Soft, non-tender, non-distended MUSCULOSKELETAL:  No edema; No deformity  SKIN: Warm and dry NEUROLOGIC:  Alert and oriented x 3 PSYCHIATRIC:  Normal affect    Signed, Jenean Lindau, MD  03/17/2017 2:51 PM    [facility]

## 2017-03-17 NOTE — Patient Instructions (Signed)
Medication Instructions:  Your physician has recommended you make the following change in your medication:  1.) START taking 2 baby aspirin daily.   Labwork: None   Testing/Procedures: Your physician has requested that you have a stress echocardiogram. For further information please visit [email or web address]. Please follow instruction sheet as given.    Follow-Up: Your physician recommends that you schedule a follow-up appointment in: 6 months   Any Other Special Instructions Will Be Listed Below (If Applicable).  Please note that any paperwork needing to be filled out by the provider will need to be addressed at the front desk prior to seeing the provider. Please note that any paperwork FMLA, Disability or other documents regarding health condition is subject to a $25.00 charge that must be received prior to completion of paperwork.     If you need a refill on your cardiac medications before your next appointment, please call your pharmacy.

## 2017-03-17 NOTE — Addendum Note (Signed)
Addended by: Kathyrn Sheriff on: 03/17/2017 03:26 PM   Modules accepted: Orders

## 2017-03-17 NOTE — Addendum Note (Signed)
Addended by: Kathyrn Sheriff on: 03/17/2017 03:10 PM   Modules accepted: Orders

## 2017-04-23 ENCOUNTER — Telehealth (HOSPITAL_COMMUNITY): Payer: Self-pay | Admitting: *Deleted

## 2017-04-23 NOTE — Telephone Encounter (Signed)

## 2017-04-29 ENCOUNTER — Telehealth: Payer: Self-pay

## 2017-04-29 ENCOUNTER — Other Ambulatory Visit: Payer: Self-pay

## 2017-04-29 DIAGNOSIS — R9431 Abnormal electrocardiogram [ECG] [EKG]: Secondary | ICD-10-CM

## 2017-04-29 NOTE — Telephone Encounter (Signed)
Lm for pt to call back so that he could be informed that insurance will  not approve the Stress echo that was ordered. Instead we will start with just a GXT and then go from there. Pt needs this scheduled and new order has been put in.

## 2017-04-30 ENCOUNTER — Other Ambulatory Visit (HOSPITAL_COMMUNITY): Payer: BLUE CROSS/BLUE SHIELD

## 2017-05-12 ENCOUNTER — Ambulatory Visit (INDEPENDENT_AMBULATORY_CARE_PROVIDER_SITE_OTHER): Payer: BLUE CROSS/BLUE SHIELD

## 2017-05-12 DIAGNOSIS — R9431 Abnormal electrocardiogram [ECG] [EKG]: Secondary | ICD-10-CM

## 2017-05-12 LAB — EXERCISE TOLERANCE TEST
CSEPED: 7 min
CSEPEW: 8.5 METS
CSEPHR: 101 %
Exercise duration (sec): 0 s
MPHR: 161 {beats}/min
Peak HR: 164 {beats}/min
RPE: 15
Rest HR: 86 {beats}/min

## 2017-05-31 ENCOUNTER — Other Ambulatory Visit: Payer: Self-pay | Admitting: Family Medicine

## 2017-06-01 NOTE — Telephone Encounter (Signed)
Please call in refill of Alprazolam as approved.

## 2017-06-02 NOTE — Telephone Encounter (Signed)
Called in Rx to pharmacy.

## 2017-06-10 ENCOUNTER — Other Ambulatory Visit: Payer: Self-pay | Admitting: Family Medicine

## 2017-08-19 ENCOUNTER — Ambulatory Visit: Payer: Self-pay | Admitting: Family Medicine

## 2017-08-24 ENCOUNTER — Other Ambulatory Visit: Payer: Self-pay

## 2017-08-24 ENCOUNTER — Ambulatory Visit: Payer: BLUE CROSS/BLUE SHIELD | Admitting: Family Medicine

## 2017-08-24 ENCOUNTER — Encounter: Payer: Self-pay | Admitting: Family Medicine

## 2017-08-24 VITALS — BP 138/82 | HR 96 | Temp 97.9°F | Resp 16 | Ht 70.08 in | Wt 202.0 lb

## 2017-08-24 DIAGNOSIS — E7800 Pure hypercholesterolemia, unspecified: Secondary | ICD-10-CM

## 2017-08-24 DIAGNOSIS — Z9989 Dependence on other enabling machines and devices: Secondary | ICD-10-CM | POA: Diagnosis not present

## 2017-08-24 DIAGNOSIS — E7439 Other disorders of intestinal carbohydrate absorption: Secondary | ICD-10-CM | POA: Diagnosis not present

## 2017-08-24 DIAGNOSIS — R7989 Other specified abnormal findings of blood chemistry: Secondary | ICD-10-CM

## 2017-08-24 DIAGNOSIS — F99 Mental disorder, not otherwise specified: Secondary | ICD-10-CM

## 2017-08-24 DIAGNOSIS — G4733 Obstructive sleep apnea (adult) (pediatric): Secondary | ICD-10-CM | POA: Diagnosis not present

## 2017-08-24 DIAGNOSIS — R9431 Abnormal electrocardiogram [ECG] [EKG]: Secondary | ICD-10-CM | POA: Diagnosis not present

## 2017-08-24 DIAGNOSIS — G43709 Chronic migraine without aura, not intractable, without status migrainosus: Secondary | ICD-10-CM | POA: Diagnosis not present

## 2017-08-24 DIAGNOSIS — R945 Abnormal results of liver function studies: Secondary | ICD-10-CM | POA: Diagnosis not present

## 2017-08-24 DIAGNOSIS — F5105 Insomnia due to other mental disorder: Secondary | ICD-10-CM | POA: Diagnosis not present

## 2017-08-24 DIAGNOSIS — N5202 Corporo-venous occlusive erectile dysfunction: Secondary | ICD-10-CM

## 2017-08-24 DIAGNOSIS — F411 Generalized anxiety disorder: Secondary | ICD-10-CM

## 2017-08-24 DIAGNOSIS — Z23 Encounter for immunization: Secondary | ICD-10-CM

## 2017-08-24 DIAGNOSIS — M5136 Other intervertebral disc degeneration, lumbar region: Secondary | ICD-10-CM | POA: Diagnosis not present

## 2017-08-24 LAB — POCT URINALYSIS DIP (MANUAL ENTRY)
BILIRUBIN UA: NEGATIVE
GLUCOSE UA: NEGATIVE mg/dL
Leukocytes, UA: NEGATIVE
Nitrite, UA: NEGATIVE
PH UA: 6 (ref 5.0–8.0)
Protein Ur, POC: 30 mg/dL — AB
RBC UA: NEGATIVE
UROBILINOGEN UA: 0.2 U/dL

## 2017-08-24 MED ORDER — DULOXETINE HCL 60 MG PO CPEP: 60.0000 mg | ORAL_CAPSULE | Freq: Every day | ORAL | 1 refills | Status: DC

## 2017-08-24 MED ORDER — RIZATRIPTAN BENZOATE 10 MG PO TBDP: 10.0000 mg | ORAL_TABLET | ORAL | 5 refills | Status: DC | PRN

## 2017-08-24 MED ORDER — ATORVASTATIN CALCIUM 20 MG PO TABS: 20.0000 mg | ORAL_TABLET | Freq: Every day | ORAL | 1 refills | Status: DC

## 2017-08-24 MED ORDER — ALPRAZOLAM 1 MG PO TABS: 1.0000 mg | ORAL_TABLET | Freq: Every evening | ORAL | 3 refills | Status: DC | PRN

## 2017-08-24 MED ORDER — SILDENAFIL CITRATE 100 MG PO TABS: 100.0000 mg | ORAL_TABLET | Freq: Every day | ORAL | 5 refills | Status: DC | PRN

## 2017-08-24 NOTE — Progress Notes (Signed)
Subjective:    Patient ID: John Preston, male    DOB: 1956/05/06, 62 y.o.   MRN: [id number]  08/24/2017  Chronic Conditions (6 month follow-up ) and Medication Refill    HPI This 62 y.o. male presents for six month follow-up evaluation of hypercholesterolemia, glucose intolerance, anxiety/depression, migraines.  Sleeping a lot.  Loving retirement.  Goes to bed at 12:00-1:00am; wakes up at 10:00am.  Cardiologist recommended starting two ASA 81mg  daily. Did get down to 187 pounds before the holidays. [place]; 1-2 days per week now; before the holidays, going 3-4 days per week.  Walking 3 miles and weight.  Walking dog daily; slow walk.   Insurance would not cover a stress echo; insurance only would only pay for a stress test.   Reports undergoing stress testing. Feels no different. Went on low carb diet; cut out potatoes, tortillas, pasta, bread.  Tried to cut back on alcohol.  No drinking for two weeks.   Had gained to 220 pounds after last visit; had lost 30 pounds.  Has never been that serious about dieting. Loves pasta.    Migraines much improved; one migraine in six months.   OSA non-compliant.  No longer snoring.  Waking up feeling refreshed.  No gasping for air.  Now can sleep on stomach.  On back, snores.   Maxalt works great.  No processed foods any longer now that retired.  Anxiety with depression: Patient reports good compliance with medication, good tolerance to medication, and good symptom control.  Taking Cymbalta 60mg  once daily.  Not taking Xanax every night yet must take two Xanax to sleep; not taking every night.  Refills every month to ensure does not run out.  Every other night or more.   Drinks too much and knows this.  Falls asleep well yet cannot stay asleep.  Xanax helps patient sleep well.   BP Readings from Last 3 Encounters:  08/24/17 138/82  03/17/17 118/68  02/13/17 130/90   Wt Readings from Last 3 Encounters:  08/24/17 202 lb (91.6 kg)    03/17/17 207 lb 1.9 oz (93.9 kg)  02/13/17 213 lb (96.6 kg)   Immunization History  Administered Date(s) Administered  . Influenza Split 06/28/2012  . Influenza,inj,Quad PF,6+ Mos 09/05/2013, 04/27/2014, 04/25/2015, 04/16/2016, 08/24/2017  . Influenza-Unspecified 06/13/2010, 06/16/2011  . Tdap 06/16/2011    Review of Systems  Constitutional: Negative for activity change, appetite change, chills, diaphoresis, fatigue and fever.  Respiratory: Negative for cough and shortness of breath.   Cardiovascular: Negative for chest pain, palpitations and leg swelling.  Gastrointestinal: Negative for abdominal pain, diarrhea, nausea and vomiting.  Endocrine: Negative for cold intolerance, heat intolerance, polydipsia, polyphagia and polyuria.  Musculoskeletal: Positive for back pain.  Skin: Negative for color change, rash and wound.  Neurological: Negative for dizziness, tremors, seizures, syncope, facial asymmetry, speech difficulty, weakness, light-headedness, numbness and headaches.  Psychiatric/Behavioral: Positive for sleep disturbance. Negative for dysphoric mood, self-injury and suicidal ideas. The patient is not nervous/anxious.     Past Medical History:  Diagnosis Date  . Anxiety state, unspecified   . Arthritis   . Basal cell carcinoma of skin, site unspecified    L nasal; [place].  . Depression   . Encounter for therapeutic drug monitoring   . Genital herpes   . Genital herpes, unspecified   . Insomnia, unspecified   . Internal hemorrhoids    colonoscopy 08/04/2008.  [facility].  . Internal hemorrhoids without mention of complication   . Lumbago   .  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 OSA on CPAP 09/27/2014  . Other specified inflammatory disease of prostate    outlet obstruction with BPH.Urology consult 2006 [facility]  . Other specified viral warts   . Pain in joint, shoulder region   . Pure hypercholesterolemia   . Sleep apnea   . Sleep apnea   . Sleep related  headaches 06/22/2014  . Unspecified hearing loss   . Unspecified vitamin D deficiency    Past Surgical History:  Procedure Laterality Date  . COLONOSCOPY  08/04/2008   internal hemorrhoids; repeat in 10 years.  [facility].  [facility] KNEE SURGERY  1991  . L. facial basal cell carcinoma resection  11/2010  . PROSTATE SURGERY  2010   for BPH  . VASECTOMY     No Known Allergies Current Outpatient Medications on File Prior to Visit  Medication Sig Dispense Refill  . acyclovir (ZOVIRAX) 800 MG tablet Take 1 tablet (800 mg total) by mouth 3 (three) times daily. 30 tablet 4  . aspirin EC 81 MG tablet Take 2 tablets (162 mg total) by mouth daily. 90 tablet 3  . indomethacin (INDOCIN) 25 MG capsule Take 2 capsules (50 mg total) by mouth 2 (two) times daily with a meal. 60 capsule 1   No current facility-administered medications on file prior to visit.    Social History   Socioeconomic History  . Marital status: Married    Spouse name: Kieth Brightly  . Number of children: 2  . Years of education: Not on file  . Highest education level: Not on file  Social Needs  . Financial resource strain: Not on file  . Food insecurity - worry: Not on file  . Food insecurity - inability: Not on file  . Transportation needs - medical: Not on file  . Transportation needs - non-medical: Not on file  Occupational History  . Occupation: Sales  Tobacco Use  . Smoking status: Never Smoker  . Smokeless tobacco: Never Used  Substance and Sexual Activity  . Alcohol use: Yes    Alcohol/week: 9.0 oz    Types: 15 Glasses of wine per week    Comment: moderate 1-2 glasses of wine per day; more on weekends.  . Drug use: No    Comment: prevoiusly used marijuana  . Sexual activity: Yes    Birth control/protection: Post-menopausal  Other Topics Concern  . Not on file  Social History Narrative   Marital status:  Married x 33 years, happily.      Children: two adult children (Chelsea and Theresia Lo); no grandchildren.      Lives: with  wife.      Employment:  Retired in 2018.      Tobacco:  None       Alcohol:   2-3 glasses of beer per night.  Usually 3 on weekends.      Drugs: none      Exercise:none in 2018.  NO yard work due to lower back pain.      Seatbelts:  Always uses seat belts.  No texting while driving.       Home safety:  Smoke alarm in the home. Carbon monoxide detector in the home.       Guns:  Guns in the home not stored in locked cabinet.       Caffeine use: 2 servings Coffee per day.   Family History  Problem Relation Age of Onset  . COPD Mother   . Diabetes Mother   . Heart disease  Mother 44       cardiac stenting/CAD; no AMI  . Cancer Maternal Grandmother   . Diabetes Maternal Grandmother   . Tics Father   . Dementia Father   . Mental illness Father   . Lung disease Unknown        Oxygen dependent       Objective:    BP 138/82   Pulse 96   Temp 97.9 F (36.6 C) (Oral)   Resp 16   Ht 5' 10.08" (1.78 m)   Wt 202 lb (91.6 kg)   SpO2 98%   BMI 28.92 kg/m  Physical Exam  Constitutional: He is oriented to person, place, and time. He appears well-developed and well-nourished. No distress.  HENT:  Head: Normocephalic and atraumatic.  Right Ear: External ear normal.  Left Ear: External ear normal.  Nose: Nose normal.  Mouth/Throat: Oropharynx is clear and moist.  Eyes: Conjunctivae and EOM are normal. Pupils are equal, round, and reactive to light.  Neck: Normal range of motion. Neck supple. Carotid bruit is not present. No thyromegaly present.  Cardiovascular: Normal rate, regular rhythm, normal heart sounds and intact distal pulses. Exam reveals no gallop and no friction rub.  No murmur heard. Pulmonary/Chest: Effort normal and breath sounds normal. He has no wheezes. He has no rales.  Abdominal: Soft. Bowel sounds are normal. He exhibits no distension and no mass. There is no tenderness. There is no rebound and no guarding.  Lymphadenopathy:    He has no cervical adenopathy.    Neurological: He is alert and oriented to person, place, and time. No cranial nerve deficit. He exhibits normal muscle tone. Coordination normal.  Skin: Skin is warm and dry. No rash noted. He is not diaphoretic.  Psychiatric: He has a normal mood and affect. His behavior is normal. Judgment and thought content normal.  Nursing note and vitals reviewed.  No results found. Depression screen [facility] 2/9 08/24/2017 02/13/2017 04/16/2016 10/10/2015 10/03/2015  Decreased Interest 0 0 0 0 0  Down, Depressed, Hopeless 0 0 0 0 0  PHQ - 2 Score 0 0 0 0 0   Fall Risk  08/24/2017 02/13/2017 04/16/2016 10/02/2015 02/20/2015  Falls in the past year? No No No No No        Assessment & Plan:   1. Pure hypercholesterolemia   2. Need for prophylactic vaccination and inoculation against influenza   3. Glucose intolerance   4. Elevated liver function tests   5. Chronic migraine without aura without status migrainosus, not intractable   6. OSA on CPAP   7. DDD (degenerative disc disease), lumbar   8. Corporo-venous occlusive erectile dysfunction   9. Abnormal electrocardiogram (ECG) (EKG)   10. Generalized anxiety disorder   11. Insomnia due to other mental disorder   12. Other abnormal glucose    Moderately controlled hypercholesterolemia.  Obtain labs.  Refill of atorvastatin 20 mg daily provided.  Glucose intolerance: Congratulations on weight loss and exercise since last visit.  Repeat labs today.   I recommend weight loss, exercise, and low-carbohydrate low-sugar food choices. You should AVOID: regular sodas, sweetened tea, fruit juices.  You should LIMIT: breads, pastas, rice, potatoes, and desserts/sweets.  I would recommend limiting your total carbohydrate intake per meal to 45 grams; I would limit your total carbohydrate intake per snack to 30 grams.  I would also have a goal of 60 grams of protein intake per day; this would equal 10-15 grams of protein per meal and 5-10  grams of protein per  snack.  Elevated liver function tests: Likely secondary to excessive alcohol intake as well as overweight status.  Congratulations on weight loss.  Repeat liver function test today with hepatitis panel.  Recommend decreasing alcohol intake as well as avoiding Tylenol-containing products.  Will warrant abdominal ultrasound if levels remain elevated.  Migraines much improved Since retirement.  Abnormal EKG: Status post cardiology consultation with stress testing that was negative.  Clearance to proceed with Viagra usage for erectile dysfunction.  Currently.  No changes to therapy.  Patient continues to suffer with chronic insomnia which is due to anxiety as well as excessive alcohol intake.  Had a very long discussion with patient again regarding long-term Xanax use.  We discussed the risk of dementia with long-term benzodiazepine use.  Patient willing to take risk of development of dementia and request ongoing Xanax usage.  I encouraged decreased alcohol intake to avoid disruption of REM sleep.    Orders Placed This Encounter  Procedures  . Flu Vaccine QUAD 36+ mos IM  . CBC with Differential/Platelet  . Comprehensive metabolic panel    Order Specific Question:   Has the patient fasted?    Answer:   No  . Lipid panel    Order Specific Question:   Has the patient fasted?    Answer:   No  . Hemoglobin A1c  . Acute Hep Panel & Hep B Surface Ab  . POCT urinalysis dipstick   Meds ordered this encounter  Medications  . atorvastatin (LIPITOR) 20 MG tablet    Sig: Take 1 tablet (20 mg total) by mouth daily.    Dispense:  90 tablet    Refill:  1  . DULoxetine (CYMBALTA) 60 MG capsule    Sig: Take 1 capsule (60 mg total) by mouth daily.    Dispense:  90 capsule    Refill:  1  . rizatriptan (MAXALT-MLT) 10 MG disintegrating tablet    Sig: Take 1 tablet (10 mg total) by mouth as needed for migraine. May repeat in 2 hours if needed    Dispense:  8 tablet    Refill:  5  . sildenafil (VIAGRA)  100 MG tablet    Sig: Take 1 tablet (100 mg total) by mouth daily as needed for erectile dysfunction.    Dispense:  8 tablet    Refill:  5  . ALPRAZolam (XANAX) 1 MG tablet    Sig: Take 1 tablet (1 mg total) by mouth at bedtime as needed for anxiety.    Dispense:  30 tablet    Refill:  3    Not to exceed 3 additional fills before 08/12/2017.    No Follow-up on file.   Gareld Obrecht Elayne Guerin, M.D. Primary [facility] at [facility] [address] [place], [place]  [address] [phone] phone [phone] fax

## 2017-08-24 NOTE — Patient Instructions (Signed)
     IF you received an x-ray today, you will receive an invoice from [facility]. Please contact [facility] at [phone] with questions or concerns regarding your invoice.   IF you received labwork today, you will receive an invoice from [facility]. Please contact [facility] at [phone] with questions or concerns regarding your invoice.   Our billing staff will not be able to assist you with questions regarding bills from these companies.  You will be contacted with the lab results as soon as they are available. The fastest way to get your results is to activate your My Chart account. Instructions are located on the last page of this paperwork. If you have not heard from us regarding the results in 2 weeks, please contact this office.     

## 2017-08-25 LAB — CBC WITH DIFFERENTIAL/PLATELET
BASOS ABS: 0 10*3/uL (ref 0.0–0.2)
Basos: 1 %
EOS (ABSOLUTE): 0.3 10*3/uL (ref 0.0–0.4)
Eos: 5 %
Hematocrit: 44 % (ref 37.5–51.0)
Hemoglobin: 15.4 g/dL (ref 13.0–17.7)
IMMATURE GRANS (ABS): 0 10*3/uL (ref 0.0–0.1)
Immature Granulocytes: 0 %
LYMPHS: 27 %
Lymphocytes Absolute: 1.4 10*3/uL (ref 0.7–3.1)
MCH: 33.3 pg — AB (ref 26.6–33.0)
MCHC: 35 g/dL (ref 31.5–35.7)
MCV: 95 fL (ref 79–97)
MONOS ABS: 0.4 10*3/uL (ref 0.1–0.9)
Monocytes: 8 %
NEUTROS ABS: 3.2 10*3/uL (ref 1.4–7.0)
Neutrophils: 59 %
PLATELETS: 236 10*3/uL (ref 150–379)
RBC: 4.63 x10E6/uL (ref 4.14–5.80)
RDW: 13.4 % (ref 12.3–15.4)
WBC: 5.4 10*3/uL (ref 3.4–10.8)

## 2017-08-25 LAB — LIPID PANEL
CHOLESTEROL TOTAL: 217 mg/dL — AB (ref 100–199)
Chol/HDL Ratio: 3.9 ratio (ref 0.0–5.0)
HDL: 55 mg/dL (ref >39)
LDL Calculated: 115 mg/dL — ABNORMAL HIGH (ref 0–99)
TRIGLYCERIDES: 236 mg/dL — AB (ref 0–149)
VLDL CHOLESTEROL CAL: 47 mg/dL — AB (ref 5–40)

## 2017-08-25 LAB — COMPREHENSIVE METABOLIC PANEL
ALK PHOS: 62 IU/L (ref 39–117)
ALT: 98 IU/L — AB (ref 0–44)
AST: 59 IU/L — AB (ref 0–40)
Albumin/Globulin Ratio: 1.8 (ref 1.2–2.2)
Albumin: 4.4 g/dL (ref 3.5–5.5)
BILIRUBIN TOTAL: 0.4 mg/dL (ref 0.0–1.2)
BUN/Creatinine Ratio: 18 (ref 9–20)
BUN: 16 mg/dL (ref 6–24)
CHLORIDE: 100 mmol/L (ref 96–106)
CO2: 26 mmol/L (ref 20–29)
Calcium: 9.2 mg/dL (ref 8.7–10.2)
Creatinine, Ser: 0.88 mg/dL (ref 0.76–1.27)
GFR calc Af Amer: 109 mL/min/{1.73_m2} (ref >59)
GFR calc non Af Amer: 94 mL/min/{1.73_m2} (ref >59)
Globulin, Total: 2.5 g/dL (ref 1.5–4.5)
Glucose: 121 mg/dL — ABNORMAL HIGH (ref 65–99)
POTASSIUM: 4.3 mmol/L (ref 3.5–5.2)
Sodium: 141 mmol/L (ref 134–144)
TOTAL PROTEIN: 6.9 g/dL (ref 6.0–8.5)

## 2017-08-25 LAB — ACUTE HEP PANEL AND HEP B SURFACE AB
HEP B S AG: NEGATIVE
Hep A IgM: NEGATIVE
Hep B C IgM: NEGATIVE
Hep C Virus Ab: <0.1 s/co ratio (ref 0.0–0.9)
Hepatitis B Surf Ab Quant: <3.1 m[IU]/mL — ABNORMAL LOW (ref >9.9)

## 2017-08-25 LAB — HEMOGLOBIN A1C
Est. average glucose Bld gHb Est-mCnc: 114 mg/dL
Hgb A1c MFr Bld: 5.6 % (ref 4.8–5.6)

## 2017-09-03 ENCOUNTER — Other Ambulatory Visit: Payer: Self-pay | Admitting: Family Medicine

## 2017-09-03 ENCOUNTER — Encounter: Payer: Self-pay | Admitting: Family Medicine

## 2017-09-03 DIAGNOSIS — R7989 Other specified abnormal findings of blood chemistry: Secondary | ICD-10-CM

## 2017-09-03 DIAGNOSIS — R945 Abnormal results of liver function studies: Principal | ICD-10-CM

## 2017-09-03 MED ORDER — ATORVASTATIN CALCIUM 40 MG PO TABS: 40.0000 mg | ORAL_TABLET | Freq: Every day | ORAL | 1 refills | Status: DC

## 2017-09-03 NOTE — Progress Notes (Signed)
Order abdominal US for elevated liver function tests.

## 2017-09-16 ENCOUNTER — Other Ambulatory Visit: Payer: Self-pay

## 2017-09-16 ENCOUNTER — Encounter: Payer: Self-pay | Admitting: Emergency Medicine

## 2017-09-16 ENCOUNTER — Ambulatory Visit: Payer: BLUE CROSS/BLUE SHIELD | Admitting: Emergency Medicine

## 2017-09-16 VITALS — BP 130/80 | HR 86 | Temp 97.9°F | Resp 16 | Ht 69.0 in | Wt 212.0 lb

## 2017-09-16 DIAGNOSIS — M5412 Radiculopathy, cervical region: Secondary | ICD-10-CM | POA: Diagnosis not present

## 2017-09-16 DIAGNOSIS — M542 Cervicalgia: Secondary | ICD-10-CM | POA: Diagnosis not present

## 2017-09-16 DIAGNOSIS — M79601 Pain in right arm: Secondary | ICD-10-CM | POA: Diagnosis not present

## 2017-09-16 MED ORDER — METHYLPREDNISOLONE ACETATE 80 MG/ML IJ SUSP
80.0000 mg | Freq: Once | INTRAMUSCULAR | Status: AC
  Administered 2017-09-16: 80 mg via INTRAMUSCULAR

## 2017-09-16 MED ORDER — PREDNISONE 20 MG PO TABS: 40.0000 mg | ORAL_TABLET | Freq: Every day | ORAL | 0 refills | Status: AC

## 2017-09-16 MED ORDER — HYDROCODONE-ACETAMINOPHEN 5-325 MG PO TABS: 1.0000 | ORAL_TABLET | Freq: Four times a day (QID) | ORAL | 0 refills | Status: DC | PRN

## 2017-09-16 NOTE — Patient Instructions (Addendum)
     IF you received an x-ray today, you will receive an invoice from [facility]. Please contact [facility] at [phone] with questions or concerns regarding your invoice.   IF you received labwork today, you will receive an invoice from [facility]. Please contact [facility] at [phone] with questions or concerns regarding your invoice.   Our billing staff will not be able to assist you with questions regarding bills from these companies.  You will be contacted with the lab results as soon as they are available. The fastest way to get your results is to activate your My Chart account. Instructions are located on the last page of this paperwork. If you have not heard from us regarding the results in 2 weeks, please contact this office.     Cervical Radiculopathy Cervical radiculopathy means that a nerve in the neck is pinched or bruised. This can cause pain or loss of feeling (numbness) that runs from your neck to your arm and fingers. Follow these instructions at home: Managing pain  Take over-the-counter and prescription medicines only as told by your doctor.  If directed, put ice on the injured or painful area. ? Put ice in a plastic bag. ? Place a towel between your skin and the bag. ? Leave the ice on for 20 minutes, 2-3 times per day.  If ice does not help, you can try using heat. Take a warm shower or warm bath, or use a heat pack as told by your doctor.  You may try a gentle neck and shoulder massage. Activity  Rest as needed. Follow instructions from your doctor about any activities to avoid.  Do exercises as told by your doctor or physical therapist. General instructions  If you were given a soft collar, wear it as told by your doctor.  Use a flat pillow when you sleep.  Keep all follow-up visits as told by your doctor. This is important. Contact a doctor if:  Your condition does not improve with treatment. Get help right away if:  Your  pain gets worse and is not controlled with medicine.  You lose feeling or feel weak in your hand, arm, face, or leg.  You have a fever.  You have a stiff neck.  You cannot control when you poop or pee (have incontinence).  You have trouble with walking, balance, or talking. This information is not intended to replace advice given to you by your health care provider. Make sure you discuss any questions you have with your health care provider. Document Released: 07/10/2011 Document Revised: 12/27/2015 Document Reviewed: 09/14/2014 Elsevier Interactive Patient Education  2018 [employer]  

## 2017-09-16 NOTE — Progress Notes (Signed)
John Preston 62 y.o.   Chief Complaint  Patient presents with  . Back Pain    upper x 1 week and down to right arm with numbness    HISTORY OF PRESENT ILLNESS: This is a 62 y.o. male complaining of pain to the right arm with occasional numbness that started about a week ago.  Pain starts on the right side of the neck and upper back and radiates down the right arm.  Denies any recent injuries.  Has a history of lumbar disc disease.  Was seen in urgent care center down in [facility] last Sunday and given injection of Toradol.  Started on Flexeril and hydrocodone for pain.  Still having pain.  Denies any other significant symptomatology.  HPI   Prior to Admission medications   Medication Sig Start Date End Date Taking? Authorizing Provider  acyclovir (ZOVIRAX) 800 MG tablet Take 1 tablet (800 mg total) by mouth 3 (three) times daily. 02/13/17  Yes Wardell Honour, MD  ALPRAZolam Duanne Moron) 1 MG tablet Take 1 tablet (1 mg total) by mouth at bedtime as needed for anxiety. 08/24/17  Yes Wardell Honour, MD  aspirin EC 81 MG tablet Take 2 tablets (162 mg total) by mouth daily. 03/17/17  Yes Revankar, Reita Cliche, MD  atorvastatin (LIPITOR) 40 MG tablet Take 1 tablet (40 mg total) by mouth daily. 09/03/17  Yes Wardell Honour, MD  cyclobenzaprine (FLEXERIL) 10 MG tablet Take 10 mg by mouth 3 (three) times daily as needed for muscle spasms.   Yes [provider]  DULoxetine (CYMBALTA) 60 MG capsule Take 1 capsule (60 mg total) by mouth daily. 08/24/17  Yes Wardell Honour, MD  HYDROcodone-acetaminophen (NORCO/VICODIN) 5-325 MG tablet Take 1 tablet by mouth every 4 (four) hours as needed for moderate pain.   Yes [provider]  indomethacin (INDOCIN) 25 MG capsule Take 2 capsules (50 mg total) by mouth 2 (two) times daily with a meal. 02/13/17  Yes Wardell Honour, MD  rizatriptan (MAXALT-MLT) 10 MG disintegrating tablet Take 1 tablet (10 mg total) by mouth as needed for migraine. May  repeat in 2 hours if needed 08/24/17  Yes Wardell Honour, MD  sildenafil (VIAGRA) 100 MG tablet Take 1 tablet (100 mg total) by mouth daily as needed for erectile dysfunction. 08/24/17  Yes Wardell Honour, MD  HYDROcodone-acetaminophen (NORCO) 5-325 MG tablet Take 1 tablet by mouth every 6 (six) hours as needed for moderate pain. 09/16/17   Horald Pollen, MD  predniSONE (DELTASONE) 20 MG tablet Take 2 tablets (40 mg total) by mouth daily with breakfast for 5 days. 09/16/17 09/21/17  Horald Pollen, MD    No Known Allergies  Patient Active Problem List   Diagnosis Date Noted  . Abnormal electrocardiogram (ECG) (EKG) 03/01/2017  . Corporo-venous occlusive erectile dysfunction 03/01/2017  . Migraine 05/28/2015  . OSA on CPAP 09/27/2014  . Insomnia 03/06/2014  . Pure hypercholesterolemia 01/20/2013  . Other abnormal glucose 01/20/2013  . Generalized anxiety disorder 01/20/2013  . DDD (degenerative disc disease), lumbar 01/20/2013    Past Medical History:  Diagnosis Date  . Anxiety state, unspecified   . Arthritis   . Basal cell carcinoma of skin, site unspecified    L nasal; [facility].  . Depression   . Encounter for therapeutic drug monitoring   . Genital herpes   . Genital herpes, unspecified   . Insomnia, unspecified   . Internal hemorrhoids    colonoscopy 08/04/2008.  [facility].  . Internal hemorrhoids without mention of complication   . Lumbago   . 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 OSA on CPAP 09/27/2014  . Other specified inflammatory disease of prostate    outlet obstruction with BPH.Urology consult 2006 [facility]  . Other specified viral warts   . Pain in joint, shoulder region   . Pure hypercholesterolemia   . Sleep apnea   . Sleep apnea   . Sleep related headaches 06/22/2014  . Unspecified hearing loss   . Unspecified vitamin D deficiency     Past Surgical History:  Procedure Laterality Date  . COLONOSCOPY  08/04/2008   internal hemorrhoids;  repeat in 10 years.  [facility].  Marland Kitchen KNEE SURGERY  1991  . L. facial basal cell carcinoma resection  11/2010  . PROSTATE SURGERY  2010   for BPH  . VASECTOMY      Social History   Socioeconomic History  . Marital status: Married    Spouse name: Kieth Brightly  . Number of children: 2  . Years of education: Not on file  . Highest education level: Not on file  Social Needs  . Financial resource strain: Not on file  . Food insecurity - worry: Not on file  . Food insecurity - inability: Not on file  . Transportation needs - medical: Not on file  . Transportation needs - non-medical: Not on file  Occupational History  . Occupation: Sales  Tobacco Use  . Smoking status: Never Smoker  . Smokeless tobacco: Never Used  Substance and Sexual Activity  . Alcohol use: Yes    Alcohol/week: 9.0 oz    Types: 15 Glasses of wine per week    Comment: moderate 1-2 glasses of wine per day; more on weekends.  . Drug use: No    Comment: prevoiusly used marijuana  . Sexual activity: Yes    Birth control/protection: Post-menopausal  Other Topics Concern  . Not on file  Social History Narrative   Marital status:  Married x 33 years, happily.      Children: two adult children (Chelsea and Theresia Lo); no grandchildren.      Lives: with wife.      Employment:  Retired in 2018.      Tobacco:  None       Alcohol:   2-3 glasses of beer per night.  Usually 3 on weekends.      Drugs: none      Exercise:none in 2018.  NO yard work due to lower back pain.      Seatbelts:  Always uses seat belts.  No texting while driving.       Home safety:  Smoke alarm in the home. Carbon monoxide detector in the home.       Guns:  Guns in the home not stored in locked cabinet.       Caffeine use: 2 servings Coffee per day.    Family History  Problem Relation Age of Onset  . COPD Mother   . Diabetes Mother   . Heart disease Mother 71       cardiac stenting/CAD; no AMI  . Cancer Maternal Grandmother   . Diabetes Maternal  Grandmother   . Tics Father   . Dementia Father   . Mental illness Father   . Lung disease Unknown        Oxygen dependent     Review of Systems  Constitutional: Negative.  Negative for chills, fever and weight loss.  HENT: Negative.  Negative  for nosebleeds and sore throat.   Eyes: Negative.  Negative for blurred vision and double vision.  Respiratory: Negative.  Negative for cough and shortness of breath.   Cardiovascular: Negative.  Negative for palpitations and leg swelling.  Gastrointestinal: Negative.  Negative for abdominal pain, nausea and vomiting.  Genitourinary: Negative.  Negative for dysuria and hematuria.  Musculoskeletal: Positive for back pain and neck pain.  Skin: Negative.  Negative for rash.  Neurological: Negative for dizziness, focal weakness, loss of consciousness and headaches.  Endo/Heme/Allergies: Negative.   All other systems reviewed and are negative.   Vitals:   09/16/17 0847  BP: 130/80  Pulse: 86  Resp: 16  Temp: 97.9 F (36.6 C)  SpO2: 98%    Physical Exam  Constitutional: He is oriented to person, place, and time. He appears well-developed and well-nourished.  HENT:  Head: Normocephalic and atraumatic.  Right Ear: External ear normal.  Left Ear: External ear normal.  Nose: Nose normal.  Mouth/Throat: Oropharynx is clear and moist.  Eyes: Conjunctivae and EOM are normal. Pupils are equal, round, and reactive to light.  Neck: Normal range of motion. Neck supple. No JVD present.  Cardiovascular: Normal rate, regular rhythm and normal heart sounds.  Pulmonary/Chest: Effort normal and breath sounds normal.  Musculoskeletal:  Positive tenderness to right side of the neck and right trapezius muscle with spasm.  No bruising or erythema.  Lymphadenopathy:    He has no cervical adenopathy.  Neurological: He is alert and oriented to person, place, and time. He displays normal reflexes. No sensory deficit. He exhibits normal muscle tone.  Coordination normal.  Skin: Skin is warm and dry. Capillary refill takes less than 2 seconds. No rash noted.  Psychiatric: He has a normal mood and affect. His behavior is normal.  Vitals reviewed.    ASSESSMENT & PLAN: Vedanth was seen today for back pain.  Diagnoses and all orders for this visit:  Right arm pain -     HYDROcodone-acetaminophen (NORCO) 5-325 MG tablet; Take 1 tablet by mouth every 6 (six) hours as needed for moderate pain.  Cervical radiculopathy -     methylPREDNISolone acetate (DEPO-MEDROL) injection 80 mg -     predniSONE (DELTASONE) 20 MG tablet; Take 2 tablets (40 mg total) by mouth daily with breakfast for 5 days. -     Ambulatory referral to Orthopedic Surgery -     MR Cervical Spine Wo Contrast; Future  Neck pain -     HYDROcodone-acetaminophen (NORCO) 5-325 MG tablet; Take 1 tablet by mouth every 6 (six) hours as needed for moderate pain. -     Ambulatory referral to Orthopedic Surgery    Patient Instructions       IF you received an x-ray today, you will receive an invoice from [facility]. Please contact [facility] at [phone] with questions or concerns regarding your invoice.   IF you received labwork today, you will receive an invoice from [facility]. Please contact [facility] at [phone] with questions or concerns regarding your invoice.   Our billing staff will not be able to assist you with questions regarding bills from these companies.  You will be contacted with the lab results as soon as they are available. The fastest way to get your results is to activate your My Chart account. Instructions are located on the last page of this paperwork. If you have not heard from [place] regarding the results in 2 weeks, please contact this office.     Cervical Radiculopathy  Cervical radiculopathy means that a nerve in the neck is pinched or bruised. This can cause pain or loss of feeling (numbness) that runs from your neck to your  arm and fingers. Follow these instructions at home: Managing pain  Take over-the-counter and prescription medicines only as told by your doctor.  If directed, put ice on the injured or painful area. ? Put ice in a plastic bag. ? Place a towel between your skin and the bag. ? Leave the ice on for 20 minutes, 2-3 times per day.  If ice does not help, you can try using heat. Take a warm shower or warm bath, or use a heat pack as told by your doctor.  You may try a gentle neck and shoulder massage. Activity  Rest as needed. Follow instructions from your doctor about any activities to avoid.  Do exercises as told by your doctor or physical therapist. General instructions  If you were given a soft collar, wear it as told by your doctor.  Use a flat pillow when you sleep.  Keep all follow-up visits as told by your doctor. This is important. Contact a doctor if:  Your condition does not improve with treatment. Get help right away if:  Your pain gets worse and is not controlled with medicine.  You lose feeling or feel weak in your hand, arm, face, or leg.  You have a fever.  You have a stiff neck.  You cannot control when you poop or pee (have incontinence).  You have trouble with walking, balance, or talking. This information is not intended to replace advice given to you by your health care provider. Make sure you discuss any questions you have with your health care provider. Document Released: 07/10/2011 Document Revised: 12/27/2015 Document Reviewed: 09/14/2014 Elsevier Interactive Patient Education  2018 [employer].      Agustina Caroli, MD [facility]

## 2017-09-21 ENCOUNTER — Ambulatory Visit (INDEPENDENT_AMBULATORY_CARE_PROVIDER_SITE_OTHER): Payer: Self-pay

## 2017-09-21 ENCOUNTER — Ambulatory Visit (INDEPENDENT_AMBULATORY_CARE_PROVIDER_SITE_OTHER): Payer: BLUE CROSS/BLUE SHIELD | Admitting: Orthopaedic Surgery

## 2017-09-21 ENCOUNTER — Encounter (INDEPENDENT_AMBULATORY_CARE_PROVIDER_SITE_OTHER): Payer: Self-pay | Admitting: Orthopaedic Surgery

## 2017-09-21 VITALS — Resp 14 | Ht 70.0 in | Wt 212.0 lb

## 2017-09-21 DIAGNOSIS — M542 Cervicalgia: Secondary | ICD-10-CM

## 2017-09-21 MED ORDER — HYDROCODONE-ACETAMINOPHEN 5-325 MG PO TABS: 1.0000 | ORAL_TABLET | ORAL | 0 refills | Status: DC | PRN

## 2017-09-21 NOTE — Progress Notes (Signed)
Office Visit Note   [name]           Date of Birth: 07-08-1956           MRN: [id number] Visit Date: 09/21/2017              Requested by: Horald Pollen, MD [place], [place] [address] PCP: Wardell Honour, MD   Assessment & Plan: Visit Diagnoses:  1. Neck pain     Plan: Cervical spine pain with referred discomfort in the right upper extremity. I'll bet that he has an HNP at C5-6 or C6-7. We'll renew his lorcet and order an MRI scan of the C-spine  Follow-Up Instructions: Return after MRI c-spine.   Orders:  Orders Placed This Encounter  Procedures  . XR Cervical Spine 2 or 3 views   No orders of the defined types were placed in this encounter.     Procedures: No procedures performed   Clinical Data: No additional findings.   Subjective: Chief Complaint  Patient presents with  . Right Shoulder - Pain, Numbness     y.o. male complaining of pain to the right arm with occasional numbness that started about a week ago.  Pain starts on the right side of the neck and upper back and radiates down the right arm.  Denies any recent injuries.  Has a history of lumbar disc   . Right Arm - Pain, Numbness    Has a history of lumbar disc disease.  Was seen in urgent care center down in [facility] last Sunday and given injection of Toradol  Initial onset of pain occurred while visiting [facility]. Was seen in the urgent care clinic and given a injection of ketorolac without much relief. Also placed on a muscle relaxant and hydrocodone. With persistent pain and after return to [facility] when he was seen at the [facility] urgent care facility. He was placed on oral prednisone and Flexeril. John Preston continues to have a problem with pain in the cervical spine referred to the right upper extremity as far distally as the tips of his fingers he's had pain. He's had numbness and tingling in the right forearm and the tips of his fingers. To date  he has not had much relief of his pain. No prior problems with neck pain. No referred pain to left upper extremity.  HPI  Review of Systems  Constitutional: Negative for fatigue.  HENT: Negative for hearing loss.   Respiratory: Positive for apnea. Negative for chest tightness and shortness of breath.   Cardiovascular: Negative for chest pain, palpitations and leg swelling.  Gastrointestinal: Negative for blood in stool, constipation and diarrhea.  Genitourinary: Negative for difficulty urinating.  Musculoskeletal: Positive for neck pain and neck stiffness. Negative for arthralgias, back pain, joint swelling and myalgias.  Neurological: Positive for weakness and numbness. Negative for headaches.  Hematological: Does not bruise/bleed easily.  Psychiatric/Behavioral: Positive for sleep disturbance. The patient is not nervous/anxious.      Objective: Vital Signs: Resp 14   Ht 5\' 10"  (1.778 m)   Wt 212 lb (96.2 kg)   BMI 30.42 kg/m   Physical Exam  Ortho Exam awake alert and oriented 3. Comfortable sitting. He is able to touch his chin to his chest but that creates pain in the right scapula and levator scapular muscle. Not quite full neck extension with similar pain in a similar distribution to that noted above. Has subjective tingling in the tips of  all of his fingers. I thought his grip is a little weaker compared to the left he is right-handed. I also thought his triceps reflexes decreased compared to the opposite side. His triceps muscle might also be weaker on the right. He actually felt that he was weak trying to put his right arm above his head, neck, combing his hair or brushing his teeth.  Specialty Comments:  No specialty comments available.  Imaging: No results found.   PMFS History: Patient Active Problem List   Diagnosis Date Noted  . Right arm pain 09/16/2017  . Cervical radiculopathy 09/16/2017  . Neck pain 09/16/2017  . Abnormal electrocardiogram (ECG) (EKG)  03/01/2017  . Corporo-venous occlusive erectile dysfunction 03/01/2017  . Migraine 05/28/2015  . OSA on CPAP 09/27/2014  . Insomnia 03/06/2014  . Pure hypercholesterolemia 01/20/2013  . Other abnormal glucose 01/20/2013  . Generalized anxiety disorder 01/20/2013  . DDD (degenerative disc disease), lumbar 01/20/2013   Past Medical History:  Diagnosis Date  . Anxiety state, unspecified   . Arthritis   . Basal cell carcinoma of skin, site unspecified    L nasal; [facility].  . Depression   . Encounter for therapeutic drug monitoring   . Genital herpes   . Genital herpes, unspecified   . Insomnia, unspecified   . Internal hemorrhoids    colonoscopy 08/04/2008.  [facility].  . Internal hemorrhoids without mention of complication   . Lumbago   . 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 OSA on CPAP 09/27/2014  . Other specified inflammatory disease of prostate    outlet obstruction with BPH.Urology consult 2006 [facility]  . Other specified viral warts   . Pain in joint, shoulder region   . Pure hypercholesterolemia   . Sleep apnea   . Sleep apnea   . Sleep related headaches 06/22/2014  . Unspecified hearing loss   . Unspecified vitamin D deficiency     Family History  Problem Relation Age of Onset  . COPD Mother   . Diabetes Mother   . Heart disease Mother 14       cardiac stenting/CAD; no AMI  . Cancer Maternal Grandmother   . Diabetes Maternal Grandmother   . Tics Father   . Dementia Father   . Mental illness Father   . Lung disease Unknown        Oxygen dependent    Past Surgical History:  Procedure Laterality Date  . COLONOSCOPY  08/04/2008   internal hemorrhoids; repeat in 10 years.  [facility].  Marland Kitchen KNEE SURGERY  1991  . L. facial basal cell carcinoma resection  11/2010  . PROSTATE SURGERY  2010   for BPH  . VASECTOMY     Social History   Occupational History  . Occupation: Sales  Tobacco Use  . Smoking status: Never Smoker  . Smokeless tobacco: Never Used    Substance and Sexual Activity  . Alcohol use: Yes    Alcohol/week: 9.0 oz    Types: 15 Glasses of wine per week    Comment: moderate 1-2 glasses of wine per day; more on weekends.  . Drug use: No    Comment: prevoiusly used marijuana  . Sexual activity: Yes    Birth control/protection: Post-menopausal

## 2017-09-22 ENCOUNTER — Ambulatory Visit (INDEPENDENT_AMBULATORY_CARE_PROVIDER_SITE_OTHER): Payer: BLUE CROSS/BLUE SHIELD | Admitting: Orthopaedic Surgery

## 2017-09-22 ENCOUNTER — Ambulatory Visit
Admission: RE | Admit: 2017-09-22 | Discharge: 2017-09-22 | Disposition: A | Discharge status: Home / Self Care | Payer: BLUE CROSS/BLUE SHIELD | Source: Ambulatory Visit | Attending: Orthopaedic Surgery | Admitting: Orthopaedic Surgery

## 2017-09-22 ENCOUNTER — Encounter (INDEPENDENT_AMBULATORY_CARE_PROVIDER_SITE_OTHER): Payer: Self-pay | Admitting: Orthopaedic Surgery

## 2017-09-22 VITALS — BP 136/91 | HR 86 | Ht 69.0 in | Wt 200.0 lb

## 2017-09-22 DIAGNOSIS — M4722 Other spondylosis with radiculopathy, cervical region: Secondary | ICD-10-CM | POA: Diagnosis not present

## 2017-09-22 DIAGNOSIS — M5020 Other cervical disc displacement, unspecified cervical region: Secondary | ICD-10-CM | POA: Diagnosis not present

## 2017-09-22 DIAGNOSIS — M542 Cervicalgia: Secondary | ICD-10-CM

## 2017-09-22 NOTE — Progress Notes (Signed)
Office Visit Note   [name]           Date of Birth: 05/21/1956           MRN: [id number] Visit Date: 09/22/2017              Requested by: Wardell Honour, MD [address] [place], [place] [address] PCP: Wardell Honour, MD   Assessment & Plan: Visit Diagnoses:  1. Protrusion of cervical intervertebral disc   2. Other spondylosis with radiculopathy, cervical region     Plan: We discussed trying to avoid taking so much narcotic pain medication.  He can take it at night if needed and once during the day if he is having significant problems.  We will proceed with use of a soft collar that he can use most of the time.  Set him up for a cervical epidural.  If this is not effective in office follow-up and he has progressive weakness then I would recommend single level C6-7 anterior cervical discectomy and fusion with allograft and plate for his disc herniation with radiculopathy.  Follow-Up Instructions: Return in about 3 weeks (around 10/13/2017).   Orders:  No orders of the defined types were placed in this encounter.  No orders of the defined types were placed in this encounter.     Procedures: No procedures performed   Clinical Data: No additional findings.   Subjective: Chief Complaint  Patient presents with  . Neck - Pain    HPI 62 year old male states he is having severe neck pain that radiates into his right arm.  He was seen in [place] when he was on vacation and had a steroid injection and also some Toradol.  He has had a prednisone Dosepak he is used heat ice he has been taking Vicodin 1 every 4  Hrs took 2 tablets last night in order to go to sleep.  Review of Systems positive for increased cholesterol history of lumbar disc degeneration.  History of sleep apnea on CPAP.,  Anxiety, history of migraines.  Otherwise negative as it pertains HPI.   Objective: Vital Signs: BP (!) 136/91   Pulse 86   Ht 5\' 9"  (1.753 m)   Wt 200 lb (90.7 kg)   BMI  29.53 kg/m   Physical Exam  Constitutional: He is oriented to person, place, and time. He appears well-developed and well-nourished.  HENT:  Head: Normocephalic and atraumatic.  Eyes: EOM are normal. Pupils are equal, round, and reactive to light.  Neck: No tracheal deviation present. No thyromegaly present.  Cardiovascular: Normal rate.  Pulmonary/Chest: Effort normal. He has no wheezes.  Abdominal: Soft. Bowel sounds are normal.  Neurological: He is alert and oriented to person, place, and time.  Skin: Skin is warm and dry. Capillary refill takes less than 2 seconds.  Psychiatric: He has a normal mood and affect. His behavior is normal. Judgment and thought content normal.    Ortho Exam patient has intact lower extremity reflexes negative logroll the hips.  Negative drop arm test.  Patient has significant triceps weakness on the right cannot do a wall push-up.  Unable to do a regular push up off the floor due to severe right triceps weakness.  Left triceps is strong.  He has wrist flexion weakness as well as finger extension weakness all C7 right innervated muscles.  No biceps weakness.  Absent triceps reflex on the right.  The rest of his  upper extremity reflexes including his opposite left triceps  bilateral brachial radialis and biceps are 2+ and symmetrical.  No wrist extension weakness.  Decreased sensation along and ring finger on the right normal on the left.  No interosseous weakness.  Lower extremity strength is strong he can walk up steps easily.  Specialty Comments:  No specialty comments available.  Imaging: Mr Cervical Spine W/o Contrast  Result Date: 09/22/2017 CLINICAL DATA:  Cervicalgia, pain radiates down the right arm EXAM: MRI CERVICAL SPINE WITHOUT CONTRAST TECHNIQUE: Multiplanar, multisequence MR imaging of the cervical spine was performed. No intravenous contrast was administered. COMPARISON:  None. FINDINGS: Alignment: Physiologic. Vertebrae: No fracture, evidence of  discitis, or bone lesion. Cord: Normal signal and morphology. Posterior Fossa, vertebral arteries, paraspinal tissues: Posterior fossa demonstrates no focal abnormality. Vertebral artery flow voids are maintained. Paraspinal soft tissues are unremarkable. Disc levels: Discs: Minimal disc height loss at C6-7. Disc spaces are otherwise maintained. C2-3: No significant disc bulge. No neural foraminal stenosis. No central canal stenosis. C3-4: No significant disc bulge. Mild bilateral foraminal narrowing. No central canal stenosis. C4-5: No significant disc bulge. Bilateral uncovertebral degenerative changes. Moderate right foraminal stenosis. Mild left foraminal stenosis. No central canal stenosis. C5-6: No significant disc bulge. No neural foraminal stenosis. No central canal stenosis. C6-7: Mild broad-based disc bulge with a small right paracentral/foraminal disc protrusion. Moderate right foraminal stenosis. No left foraminal stenosis. No central canal stenosis. C7-T1: No significant disc bulge. No neural foraminal stenosis. No central canal stenosis. IMPRESSION: 1. At C6-7 there is mild broad-based disc bulge with a small right paracentral/foraminal disc protrusion. Moderate right foraminal stenosis. Electronically Signed   By: Kathreen Devoid   On: 09/22/2017 09:22     PMFS History: Patient Active Problem List   Diagnosis Date Noted  . Right arm pain 09/16/2017  . Cervical radiculopathy 09/16/2017  . Neck pain 09/16/2017  . Abnormal electrocardiogram (ECG) (EKG) 03/01/2017  . Corporo-venous occlusive erectile dysfunction 03/01/2017  . Migraine 05/28/2015  . OSA on CPAP 09/27/2014  . Insomnia 03/06/2014  . Pure hypercholesterolemia 01/20/2013  . Other abnormal glucose 01/20/2013  . Generalized anxiety disorder 01/20/2013  . DDD (degenerative disc disease), lumbar 01/20/2013   Past Medical History:  Diagnosis Date  . Anxiety state, unspecified   . Arthritis   . Basal cell carcinoma of skin, site  unspecified    L nasal; [facility].  . Depression   . Encounter for therapeutic drug monitoring   . Genital herpes   . Genital herpes, unspecified   . Insomnia, unspecified   . Internal hemorrhoids    colonoscopy 08/04/2008.  [facility].  . Internal hemorrhoids without mention of complication   . Lumbago   . 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 OSA on CPAP 09/27/2014  . Other specified inflammatory disease of prostate    outlet obstruction with BPH.Urology consult 2006 [facility]  . Other specified viral warts   . Pain in joint, shoulder region   . Pure hypercholesterolemia   . Sleep apnea   . Sleep apnea   . Sleep related headaches 06/22/2014  . Unspecified hearing loss   . Unspecified vitamin D deficiency     Family History  Problem Relation Age of Onset  . COPD Mother   . Diabetes Mother   . Heart disease Mother 4       cardiac stenting/CAD; no AMI  . Cancer Maternal Grandmother   . Diabetes Maternal Grandmother   . Tics Father   . Dementia Father   . Mental illness Father   .  Lung disease Unknown        Oxygen dependent    Past Surgical History:  Procedure Laterality Date  . COLONOSCOPY  08/04/2008   internal hemorrhoids; repeat in 10 years.  [facility].  Marland Kitchen KNEE SURGERY  1991  . L. facial basal cell carcinoma resection  11/2010  . PROSTATE SURGERY  2010   for BPH  . VASECTOMY     Social History   Occupational History  . Occupation: Sales  Tobacco Use  . Smoking status: Never Smoker  . Smokeless tobacco: Never Used  Substance and Sexual Activity  . Alcohol use: Yes    Alcohol/week: 9.0 oz    Types: 15 Glasses of wine per week    Comment: moderate 1-2 glasses of wine per day; more on weekends.  . Drug use: No    Comment: prevoiusly used marijuana  . Sexual activity: Yes    Birth control/protection: Post-menopausal

## 2017-09-22 NOTE — Addendum Note (Signed)
Addended by: Meyer Cory on: 09/22/2017 03:33 PM   Modules accepted: Orders

## 2017-09-23 ENCOUNTER — Telehealth (INDEPENDENT_AMBULATORY_CARE_PROVIDER_SITE_OTHER): Payer: Self-pay | Admitting: *Deleted

## 2017-09-23 NOTE — Telephone Encounter (Signed)
Pt wife called stating [facility] clinic called to schedule pt but only thing they can get him in is Mar 5, wants to know if you can "work your magic" and see if you can get him in sooner then the 5th.  Please call back [phone]

## 2017-09-24 ENCOUNTER — Ambulatory Visit (INDEPENDENT_AMBULATORY_CARE_PROVIDER_SITE_OTHER): Payer: BLUE CROSS/BLUE SHIELD | Admitting: Physical Medicine and Rehabilitation

## 2017-09-24 ENCOUNTER — Telehealth (INDEPENDENT_AMBULATORY_CARE_PROVIDER_SITE_OTHER): Payer: Self-pay

## 2017-09-24 ENCOUNTER — Ambulatory Visit (INDEPENDENT_AMBULATORY_CARE_PROVIDER_SITE_OTHER): Payer: Self-pay

## 2017-09-24 ENCOUNTER — Encounter (INDEPENDENT_AMBULATORY_CARE_PROVIDER_SITE_OTHER): Payer: Self-pay | Admitting: Physical Medicine and Rehabilitation

## 2017-09-24 VITALS — BP 142/106 | HR 96

## 2017-09-24 DIAGNOSIS — M5412 Radiculopathy, cervical region: Secondary | ICD-10-CM

## 2017-09-24 DIAGNOSIS — M5010 Cervical disc disorder with radiculopathy, unspecified cervical region: Secondary | ICD-10-CM

## 2017-09-24 MED ORDER — BETAMETHASONE SOD PHOS & ACET 6 (3-3) MG/ML IJ SUSP
12.0000 mg | Freq: Once | INTRAMUSCULAR | Status: AC
  Administered 2017-09-24: 12 mg

## 2017-09-24 NOTE — Telephone Encounter (Signed)
Patient called again on 2/21. See other note in chart.

## 2017-09-24 NOTE — Telephone Encounter (Signed)
Patient was worked in to see Dr. Ernestina Patches this afternoon when they had a cancellation.

## 2017-09-24 NOTE — Telephone Encounter (Signed)
Received triage call from patient and his wife today voicing the concerns that they have about the care they have received. They stated that they have been "rushed through" this treatment process and been made to feel as if though everything has been urgent. Patient stated that they were seen by Dr Durward Fortes, who urgently made referral to Dr Lorin Mercy. Then seen by Dr Lorin Mercy who ordered an urgent MRI scan and then Dr Lorin Mercy wanted him to have an injection only to find out that it would be 10/06/17 for Dr Romona Curls soonest appointment. He stated that he is in severe pain and not getting any relief from the nerve pain with the norco that he is taking.  They would like to know what else could be done. I did apologize for any inconvenience that this may have caused to them and would make sure that you and Dr Lorin Mercy were aware. I did tell them that if Dr Lorin Mercy had said ok, we may could refer to [facility] to see if they had a sooner appointment for injection but could not promise them anything.  I also did let them know I would see if Dr Ernestina Patches had a cancellation list that they could be put on. They are requesting a call back from you and or Dr Lorin Mercy to further discuss this matter. [phone]

## 2017-09-24 NOTE — Telephone Encounter (Signed)
Please see below message. Another message was sent to me yesterday in regards to having to wait for ESI on 3/5 with Dr. Ernestina Patches. OK to see if [facility] can see sooner?  Patient and his wife request call back.

## 2017-09-24 NOTE — Patient Instructions (Signed)

## 2017-09-24 NOTE — Telephone Encounter (Signed)
Sure OK thanks.

## 2017-09-24 NOTE — Progress Notes (Deleted)
Pt states sharp pain in neck that radiates to the right shoulder and middle back. Pt also states numbness and tingling in right hand (pointer and middle finger). Pt states standing and trying to lift his right arm makes pain worse, pt states neck brace, sitting, and holding right arm makes pain better. +Driver, -BT, -Dye Allergies.

## 2017-09-29 ENCOUNTER — Telehealth (INDEPENDENT_AMBULATORY_CARE_PROVIDER_SITE_OTHER): Payer: Self-pay | Admitting: Orthopaedic Surgery

## 2017-09-29 ENCOUNTER — Ambulatory Visit (INDEPENDENT_AMBULATORY_CARE_PROVIDER_SITE_OTHER): Payer: Self-pay | Admitting: Orthopaedic Surgery

## 2017-09-29 ENCOUNTER — Telehealth (INDEPENDENT_AMBULATORY_CARE_PROVIDER_SITE_OTHER): Payer: Self-pay

## 2017-09-29 MED ORDER — HYDROCODONE-ACETAMINOPHEN 5-325 MG PO TABS: 1.0000 | ORAL_TABLET | ORAL | 0 refills | Status: DC | PRN

## 2017-09-29 NOTE — Telephone Encounter (Signed)
Please review and call Mr. Pilz to schedule. Thank you!

## 2017-09-29 NOTE — Telephone Encounter (Signed)
Script printed and put up front for pick up.

## 2017-09-29 NOTE — Telephone Encounter (Signed)
Patients wife called on behalf of patient stating that her husband is in a lot of pain still and would like to request a refill on his hydrocodone. She would also like a call back as soon as possible, she has some questions about a procedure Dr. Lorin Mercy discussed with them. CB # [phone]

## 2017-09-29 NOTE — Addendum Note (Signed)
Addended by: Meyer Cory on: 09/29/2017 01:44 PM   Modules accepted: Orders

## 2017-09-29 NOTE — Telephone Encounter (Signed)
Please advise 

## 2017-09-29 NOTE — Telephone Encounter (Signed)
Pt's wife Kieth Brightly called and states that he has called to ask for pain medication and has not heard back and wants a call back to discuss. This was a vm left on triage line cb # [phone]

## 2017-09-29 NOTE — Telephone Encounter (Signed)
I called patient and his wife and explained first message has been sent to John Preston for review. John Preston noticed he was a little better 3 days after injection, but states he was worse yesterday and is much worse today.  He states that he has heard mixed reviews on when he should notice relief. I explained that while some people have immediate relief, we normally discuss that it could take up to two weeks to notice good relief. He states that he needs pain medication and that the hydrocodone really only takes the edge off and does not help a lot. His wife also would like to find out if the insurance would approve the operation that was discussed.  Please advise.

## 2017-09-29 NOTE — Telephone Encounter (Signed)
Patient states the arm pain is giving him significant pain.  Gets relief if he holds still and does not move.  He is taken the Vicodin and states the pain is not really eased off.  States he like to proceed with surgery.  I talked with both patient and his wife.  The plan would be C6-7 anterior cervical discectomy and fusion allograft and plate with overnight stay in the hospital.  We will refill his Vicodin.  Blue sheet for surgical scheduling filled out.

## 2017-09-29 NOTE — Telephone Encounter (Signed)
OK refill , I will sign. Wife to pick up tomorrow. They want to proceed with his surgery.

## 2017-10-01 NOTE — Progress Notes (Signed)
John Preston - 62 y.o. male MRN [id number]  Date of birth: [birth year]  Office Visit Note: Visit Date: 09/24/2017 PCP: Wardell Honour, MD Referred by: Wardell Honour, MD  Subjective: Chief Complaint  Patient presents with  . Neck - Pain  . Right Shoulder - Pain  . Right Arm - Pain  . Right Hand - Pain, Numbness, Tingling   HPI: John Preston is a 62 year old right-hand-dominant gentleman who comes in today at the request of Dr. Lorin Mercy for diagnostic and therapeutic cervical epidural injection.  The patient reports worsening severe sharp neck pain that radiates into the right shoulder and shoulder blade.  He reports numbness and tingling in the right hand in the index and middle finger and somewhat of a C6-C7 distribution.  He reports raising the arm tends to make his symptoms worse.  He has been wearing a soft collar that seems to help.  He does have to hold his right arm at times for any relief.  Medications have not been that beneficial.  He has been taking some hydrocodone.  MRI findings were reviewed with the patient and this does show a C6-7 broad disc bulge with small paracentral foraminal protrusion on the right at C6-7 which would give a C7 radiculopathy radiculitis.    ROS Otherwise per HPI.  Assessment & Plan: Visit Diagnoses:  1. Cervical radiculopathy   2. Cervical disc disorder with radiculopathy     Plan: Findings:  Right C7-T1 interlaminar epidural steroid injection.    Meds & Orders:  Meds ordered this encounter  Medications  . betamethasone acetate-betamethasone sodium phosphate (CELESTONE) injection 12 mg    Orders Placed This Encounter  Procedures  . XR C-ARM NO REPORT  . Epidural Steroid injection    Follow-up: Return for Dr. Lorin Mercy on 10/13/2017.   Procedures: No procedures performed  Cervical Epidural Steroid Injection - Interlaminar Approach with Fluoroscopic Guidance  [name]      Date of Birth: 11-29-1955 MRN: [id number] PCP: Wardell Honour, MD      Visit Date: 09/24/2017   Universal Protocol:    Date/Time: 02/28/196:11 AM  Consent Given By: the patient  Position: PRONE  Additional Comments: Vital signs were monitored before and after the procedure. Patient was prepped and draped in the usual sterile fashion. The correct patient, procedure, and site was verified.   Injection Procedure Details:  Procedure Site One Meds Administered:  Meds ordered this encounter  Medications  . betamethasone acetate-betamethasone sodium phosphate (CELESTONE) injection 12 mg     Laterality: Right  Location/Site: C7-T1  Needle size: 20 G  Needle type: Touhy  Needle Placement: Paramedian epidural space  Findings:  -Comments: Excellent flow of contrast into the epidural space.  Procedure Details: Using a paramedian approach from the side mentioned above, the region overlying the inferior lamina was localized under fluoroscopic visualization and the soft tissues overlying this structure were infiltrated with 4 ml. of 1% Lidocaine without Epinephrine. A # 20 gauge, Tuohy needle was inserted into the epidural space using a paramedian approach.  The epidural space was localized using loss of resistance along with lateral and contralateral oblique bi-planar fluoroscopic views.  After negative aspirate for air, blood, and CSF, a 2 ml. volume of Isovue-250 was injected into the epidural space and the flow of contrast was observed. Radiographs were obtained for documentation purposes.   The injectate was administered into the level noted above.  Additional Comments:  The patient tolerated the procedure well Dressing: Band-Aid  Post-procedure details: Patient was observed during the procedure. Post-procedure instructions were reviewed.  Patient left the clinic in stable condition.    Clinical History: MRI CERVICAL SPINE WITHOUT CONTRAST  TECHNIQUE: Multiplanar, multisequence MR imaging of the cervical spine  was performed. No intravenous contrast was administered.  COMPARISON:  None.  FINDINGS: Alignment: Physiologic.  Vertebrae: No fracture, evidence of discitis, or bone lesion.  Cord: Normal signal and morphology.  Posterior Fossa, vertebral arteries, paraspinal tissues: Posterior fossa demonstrates no focal abnormality. Vertebral artery flow voids are maintained. Paraspinal soft tissues are unremarkable.  Disc levels:  Discs: Minimal disc height loss at C6-7. Disc spaces are otherwise maintained.  C2-3: No significant disc bulge. No neural foraminal stenosis. No central canal stenosis.  C3-4: No significant disc bulge. Mild bilateral foraminal narrowing. No central canal stenosis.  C4-5: No significant disc bulge. Bilateral uncovertebral degenerative changes. Moderate right foraminal stenosis. Mild left foraminal stenosis. No central canal stenosis.  C5-6: No significant disc bulge. No neural foraminal stenosis. No central canal stenosis.  C6-7: Mild broad-based disc bulge with a small right paracentral/foraminal disc protrusion. Moderate right foraminal stenosis. No left foraminal stenosis. No central canal stenosis.  C7-T1: No significant disc bulge. No neural foraminal stenosis. No central canal stenosis.  IMPRESSION: 1. At C6-7 there is mild broad-based disc bulge with a small right paracentral/foraminal disc protrusion. Moderate right foraminal stenosis.   Electronically Signed   By: Kathreen Devoid   On: 09/22/2017 09:22  He reports that  has never smoked. he has never used smokeless tobacco.  Recent Labs    02/13/17 1013 08/24/17 1408  HGBA1C 6.2* 5.6    Objective:  VS:  HT:    WT:   BMI:     BP:(!) 142/106  HR:96bpm  TEMP: ( )  RESP:97 % Physical Exam  Musculoskeletal:  Patient has good strength in the upper extremities bilaterally with 5 out of 5 wrist extension long finger flexion and abduction.  He has a negative Hoffmann's  bilaterally.    Ortho Exam Imaging: No results found.  Past Medical/Family/Surgical/Social History: Medications & Allergies reviewed per EMR Patient Active Problem List   Diagnosis Date Noted  . Right arm pain 09/16/2017  . Cervical radiculopathy 09/16/2017  . Neck pain 09/16/2017  . Abnormal electrocardiogram (ECG) (EKG) 03/01/2017  . Corporo-venous occlusive erectile dysfunction 03/01/2017  . Migraine 05/28/2015  . OSA on CPAP 09/27/2014  . Insomnia 03/06/2014  . Pure hypercholesterolemia 01/20/2013  . Other abnormal glucose 01/20/2013  . Generalized anxiety disorder 01/20/2013  . DDD (degenerative disc disease), lumbar 01/20/2013   Past Medical History:  Diagnosis Date  . Anxiety state, unspecified   . Arthritis   . Basal cell carcinoma of skin, site unspecified    L nasal; [facility].  . Depression   . Encounter for therapeutic drug monitoring   . Genital herpes   . Genital herpes, unspecified   . Insomnia, unspecified   . Internal hemorrhoids    colonoscopy 08/04/2008.  [facility].  . Internal hemorrhoids without mention of complication   . Lumbago   . 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 OSA on CPAP 09/27/2014  . Other specified inflammatory disease of prostate    outlet obstruction with BPH.Urology consult 2006 [facility]  . Other specified viral warts   . Pain in joint, shoulder region   . Pure hypercholesterolemia   . Sleep apnea   . Sleep apnea   . Sleep related headaches 06/22/2014  . Unspecified hearing loss   .  Unspecified vitamin D deficiency    Family History  Problem Relation Age of Onset  . COPD Mother   . Diabetes Mother   . Heart disease Mother 29       cardiac stenting/CAD; no AMI  . Cancer Maternal Grandmother   . Diabetes Maternal Grandmother   . Tics Father   . Dementia Father   . Mental illness Father   . Lung disease Unknown        Oxygen dependent   Past Surgical History:  Procedure Laterality Date  . COLONOSCOPY  08/04/2008     internal hemorrhoids; repeat in 10 years.  [facility].  Marland Kitchen KNEE SURGERY  1991  . L. facial basal cell carcinoma resection  11/2010  . PROSTATE SURGERY  2010   for BPH  . VASECTOMY     Social History   Occupational History  . Occupation: Sales  Tobacco Use  . Smoking status: Never Smoker  . Smokeless tobacco: Never Used  Substance and Sexual Activity  . Alcohol use: Yes    Alcohol/week: 9.0 oz    Types: 15 Glasses of wine per week    Comment: moderate 1-2 glasses of wine per day; more on weekends.  . Drug use: No    Comment: prevoiusly used marijuana  . Sexual activity: Yes    Birth control/protection: Post-menopausal

## 2017-10-01 NOTE — Procedures (Signed)
Cervical Epidural Steroid Injection - Interlaminar Approach with Fluoroscopic Guidance  [name]      Date of Birth: 17-Jun-1956 MRN: [id number] PCP: Wardell Honour, MD      Visit Date: 09/24/2017   Universal Protocol:    Date/Time: 02/28/196:11 AM  Consent Given By: the patient  Position: PRONE  Additional Comments: Vital signs were monitored before and after the procedure. Patient was prepped and draped in the usual sterile fashion. The correct patient, procedure, and site was verified.   Injection Procedure Details:  Procedure Site One Meds Administered:  Meds ordered this encounter  Medications  . betamethasone acetate-betamethasone sodium phosphate (CELESTONE) injection 12 mg     Laterality: Right  Location/Site: C7-T1  Needle size: 20 G  Needle type: Touhy  Needle Placement: Paramedian epidural space  Findings:  -Comments: Excellent flow of contrast into the epidural space.  Procedure Details: Using a paramedian approach from the side mentioned above, the region overlying the inferior lamina was localized under fluoroscopic visualization and the soft tissues overlying this structure were infiltrated with 4 ml. of 1% Lidocaine without Epinephrine. A # 20 gauge, Tuohy needle was inserted into the epidural space using a paramedian approach.  The epidural space was localized using loss of resistance along with lateral and contralateral oblique bi-planar fluoroscopic views.  After negative aspirate for air, blood, and CSF, a 2 ml. volume of Isovue-250 was injected into the epidural space and the flow of contrast was observed. Radiographs were obtained for documentation purposes.   The injectate was administered into the level noted above.  Additional Comments:  The patient tolerated the procedure well Dressing: Band-Aid    Post-procedure details: Patient was observed during the procedure. Post-procedure instructions were reviewed.  Patient left  the clinic in stable condition.

## 2017-10-05 NOTE — Pre-Procedure Instructions (Signed)
John Preston  10/05/2017      [id number] - [place], [address] [place] [place] [name] [place] [address] Phone: [phone] Fax: [phone]    Your procedure is scheduled on March 8  Report to [facility] [place].  Call this number if you have problems the morning of surgery:  [phone]   Remember:  Do not eat food or drink liquids after midnight.]  Continue all medications as directed by your physician except follow these medication instructions before surgery below   Take these medicines the morning of surgery with A SIP OF WATER  acyclovir (ZOVIRAX) ALPRAZolam (XANAX) DULoxetine (CYMBALTA)  HYDROcodone-acetaminophen (NORCO/VICODIN)  7 days prior to surgery STOP taking any Aspirin(unless otherwise instructed by your surgeon), Aleve, Naproxen, Ibuprofen, Motrin, Advil, Goody's, BC's, all herbal medications, fish oil, and all vitamins, indomethacin (INDOCIN)  Follow your doctors instructions regarding your Aspirin.  If no instructions were given by your doctor, then you will need to call the prescribing office office to get instructions.       Do not wear jewelry  Do not wear lotions, powders, or cologne, or deodorant.  Men may shave face and neck.  Do not bring valuables to the hospital.  [facility] is not responsible for any belongings or valuables.  Contacts, dentures or bridgework may not be worn into surgery.  Leave your suitcase in the car.  After surgery it may be brought to your room.  For patients admitted to the hospital, discharge time will be determined by your treatment team.  Patients discharged the day of surgery will not be allowed to drive home.    Special instructions:   [facility]- Preparing For Surgery  Before surgery, you can play an important role. Because skin is not sterile, your skin needs to be as free of germs as possible. You can reduce the number of germs on your skin by washing  with CHG (chlorahexidine gluconate) Soap before surgery.  CHG is an antiseptic cleaner which kills germs and bonds with the skin to continue killing germs even after washing.  Please do not use if you have an allergy to CHG or antibacterial soaps. If your skin becomes reddened/irritated stop using the CHG.  Do not shave (including legs and underarms) for at least 48 hours prior to first CHG shower. It is OK to shave your face.  Please follow these instructions carefully.   1. Shower the NIGHT BEFORE SURGERY and the MORNING OF SURGERY with CHG.   2. If you chose to wash your hair, wash your hair first as usual with your normal shampoo.  3. After you shampoo, rinse your hair and body thoroughly to remove the shampoo.  4. Use CHG as you would any other liquid soap. You can apply CHG directly to the skin and wash gently with a scrungie or a clean washcloth.   5. Apply the CHG Soap to your body ONLY FROM THE NECK DOWN.  Do not use on open wounds or open sores. Avoid contact with your eyes, ears, mouth and genitals (private parts). Wash Face and genitals (private parts)  with your normal soap.  6. Wash thoroughly, paying special attention to the area where your surgery will be performed.  7. Thoroughly rinse your body with warm water from the neck down.  8. DO NOT shower/wash with your normal soap after using and rinsing off the CHG Soap.  9. Pat yourself dry with a CLEAN TOWEL.  10. Wear CLEAN PAJAMAS to bed the night before surgery, wear comfortable clothes the morning of surgery  11. Place CLEAN SHEETS on your bed the night of your first shower and DO NOT SLEEP WITH PETS.    Day of Surgery: Do not apply any deodorants/lotions. Please wear clean clothes to the hospital/surgery center.      Please read over the following fact sheets that you were given.

## 2017-10-06 ENCOUNTER — Encounter (INDEPENDENT_AMBULATORY_CARE_PROVIDER_SITE_OTHER): Payer: Self-pay | Admitting: Physical Medicine and Rehabilitation

## 2017-10-06 ENCOUNTER — Other Ambulatory Visit: Payer: Self-pay

## 2017-10-06 ENCOUNTER — Telehealth (INDEPENDENT_AMBULATORY_CARE_PROVIDER_SITE_OTHER): Payer: Self-pay | Admitting: Orthopaedic Surgery

## 2017-10-06 ENCOUNTER — Encounter (HOSPITAL_COMMUNITY): Payer: Self-pay

## 2017-10-06 ENCOUNTER — Encounter (HOSPITAL_COMMUNITY)
Admission: RE | Admit: 2017-10-06 | Discharge: 2017-10-06 | Disposition: A | Discharge status: Home / Self Care | Payer: BLUE CROSS/BLUE SHIELD | Source: Ambulatory Visit | Attending: Orthopaedic Surgery | Admitting: Orthopaedic Surgery

## 2017-10-06 DIAGNOSIS — F419 Anxiety disorder, unspecified: Secondary | ICD-10-CM | POA: Diagnosis not present

## 2017-10-06 DIAGNOSIS — M4802 Spinal stenosis, cervical region: Secondary | ICD-10-CM | POA: Diagnosis not present

## 2017-10-06 DIAGNOSIS — E7800 Pure hypercholesterolemia, unspecified: Secondary | ICD-10-CM | POA: Diagnosis not present

## 2017-10-06 DIAGNOSIS — G4733 Obstructive sleep apnea (adult) (pediatric): Secondary | ICD-10-CM | POA: Diagnosis not present

## 2017-10-06 DIAGNOSIS — Z85828 Personal history of other malignant neoplasm of skin: Secondary | ICD-10-CM | POA: Diagnosis not present

## 2017-10-06 DIAGNOSIS — M4722 Other spondylosis with radiculopathy, cervical region: Secondary | ICD-10-CM | POA: Diagnosis not present

## 2017-10-06 DIAGNOSIS — F329 Major depressive disorder, single episode, unspecified: Secondary | ICD-10-CM | POA: Diagnosis not present

## 2017-10-06 DIAGNOSIS — M50123 Cervical disc disorder at C6-C7 level with radiculopathy: Secondary | ICD-10-CM | POA: Diagnosis present

## 2017-10-06 HISTORY — DX: Prediabetes: R73.03

## 2017-10-06 LAB — COMPREHENSIVE METABOLIC PANEL
ALT: 86 U/L — ABNORMAL HIGH (ref 17–63)
AST: 41 U/L (ref 15–41)
Albumin: 3.9 g/dL (ref 3.5–5.0)
Alkaline Phosphatase: 66 U/L (ref 38–126)
Anion gap: 12 (ref 5–15)
BUN: 20 mg/dL (ref 6–20)
CHLORIDE: 99 mmol/L — AB (ref 101–111)
CO2: 26 mmol/L (ref 22–32)
Calcium: 9.3 mg/dL (ref 8.9–10.3)
Creatinine, Ser: 1.04 mg/dL (ref 0.61–1.24)
GFR calc Af Amer: >60 mL/min (ref >60)
Glucose, Bld: 120 mg/dL — ABNORMAL HIGH (ref 65–99)
POTASSIUM: 3.9 mmol/L (ref 3.5–5.1)
SODIUM: 137 mmol/L (ref 135–145)
Total Bilirubin: 1 mg/dL (ref 0.3–1.2)
Total Protein: 6.9 g/dL (ref 6.5–8.1)

## 2017-10-06 LAB — CBC
HCT: 48.8 % (ref 39.0–52.0)
Hemoglobin: 16.9 g/dL (ref 13.0–17.0)
MCH: 33.8 pg (ref 26.0–34.0)
MCHC: 34.6 g/dL (ref 30.0–36.0)
MCV: 97.6 fL (ref 78.0–100.0)
PLATELETS: 243 10*3/uL (ref 150–400)
RBC: 5 MIL/uL (ref 4.22–5.81)
RDW: 11.9 % (ref 11.5–15.5)
WBC: 8 10*3/uL (ref 4.0–10.5)

## 2017-10-06 LAB — SURGICAL PCR SCREEN
MRSA, PCR: POSITIVE — AB
STAPHYLOCOCCUS AUREUS: POSITIVE — AB

## 2017-10-06 LAB — PROTIME-INR
INR: 0.98
PROTHROMBIN TIME: 12.9 s (ref 11.4–15.2)

## 2017-10-06 MED ORDER — HYDROCODONE-ACETAMINOPHEN 5-325 MG PO TABS: 1.0000 | ORAL_TABLET | ORAL | 0 refills | Status: DC | PRN

## 2017-10-06 NOTE — Telephone Encounter (Signed)
Please advise 

## 2017-10-06 NOTE — Telephone Encounter (Signed)
Patient's wife Kieth Brightly) called advised patient is in a lot of pain and will run out of pain meds before his surgery. Kieth Brightly asked if she can come by and pick up a Rx for enough pain medicine until his surgery. She advised will be in [place] for another hour. The number to contact Kieth Brightly is [phone] or [phone] Patient's number

## 2017-10-06 NOTE — Telephone Encounter (Signed)
Per Dr. Lorin Mercy, ok for #15. Rx printed.  Patient and his wife came in to office. Dr. Lorin Mercy spoke with patient.

## 2017-10-08 NOTE — H&P (Signed)
John Preston is an 62 y.o. male.   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  Progressively worsening symptoms.  Failed conservative treatment.  Past Medical History:  Diagnosis Date  . Anxiety state, unspecified   . Arthritis   . Basal cell carcinoma of skin, site unspecified    L nasal; [place].  . Depression   . Encounter for therapeutic drug monitoring   . Genital herpes   . Genital herpes, unspecified   . Insomnia, unspecified   . Internal hemorrhoids    colonoscopy 08/04/2008.  Wohl.  . Internal hemorrhoids without mention of complication   . Lumbago   . 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 OSA on CPAP 09/27/2014  . Other specified inflammatory disease of prostate    outlet obstruction with BPH.Urology consult 2006 [facility]  . Other specified viral warts   . Pain in joint, shoulder region   . Pre-diabetes   . Pure hypercholesterolemia   . Sleep apnea   . Sleep apnea   . Sleep related headaches 06/22/2014  . Unspecified hearing loss   . Unspecified vitamin D deficiency     Past Surgical History:  Procedure Laterality Date  . COLONOSCOPY  08/04/2008   internal hemorrhoids; repeat in 10 years.  Wohl.  Marland Kitchen KNEE SURGERY  1991  . L. facial basal cell carcinoma resection  11/2010  . PROSTATE SURGERY  2010   for BPH  . VASECTOMY      Family History  Problem Relation Age of Onset  . COPD Mother   . Diabetes Mother   . Heart disease Mother 60       cardiac stenting/CAD; no AMI  . Cancer Maternal Grandmother   . Diabetes Maternal Grandmother   . Tics Father   . Dementia Father   . Mental illness Father   . Lung disease Unknown        Oxygen dependent   Social History:  reports that  has never smoked. he has never used smokeless tobacco. He reports that he drinks about 9.0 oz of alcohol per week. He reports that he does not use drugs.  Allergies:  No Known Allergies  No medications prior to admission.    No results found for this or any previous visit (from the past 48 hour(s)). No results found.  Review of Systems  Constitutional: Negative.   HENT: Negative.   Respiratory: Negative.   Cardiovascular: Negative.   Gastrointestinal: Negative.   Genitourinary: Negative.   Musculoskeletal: Positive for neck pain.  Skin: Negative.   Neurological: Positive for tingling.  Psychiatric/Behavioral: Negative.     There were no vitals taken for this visit. Physical Exam  Constitutional: No distress.  HENT:  Head: Normocephalic and atraumatic.  Eyes: Pupils are equal, round, and reactive to light.  Respiratory: No respiratory distress.  GI: He exhibits no distension.  Musculoskeletal: He exhibits tenderness.  Skin: Skin is warm and dry.  Psychiatric: He has a normal mood and affect.     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  We will proceed with C6-7 ACDF as scheduled.  Surgical procedure along with possible risks and comp occasions discussed.  All questions answered.  Benjiman Core, PA-C 10/08/2017, 5:50 PM

## 2017-10-09 ENCOUNTER — Ambulatory Visit (HOSPITAL_COMMUNITY): Payer: BLUE CROSS/BLUE SHIELD | Admitting: Anesthesiology

## 2017-10-09 ENCOUNTER — Encounter (HOSPITAL_COMMUNITY)
Admission: RE | Disposition: A | Discharge status: Home / Self Care | Payer: Self-pay | Source: Ambulatory Visit | Attending: Orthopaedic Surgery

## 2017-10-09 ENCOUNTER — Other Ambulatory Visit: Payer: Self-pay

## 2017-10-09 ENCOUNTER — Encounter (HOSPITAL_COMMUNITY): Payer: Self-pay

## 2017-10-09 ENCOUNTER — Ambulatory Visit (HOSPITAL_COMMUNITY): Payer: BLUE CROSS/BLUE SHIELD

## 2017-10-09 ENCOUNTER — Observation Stay (HOSPITAL_COMMUNITY)
Admission: RE | Admit: 2017-10-09 | Discharge: 2017-10-10 | Disposition: A | Discharge status: Home / Self Care | Payer: BLUE CROSS/BLUE SHIELD | Source: Ambulatory Visit | Attending: Orthopaedic Surgery | Admitting: Orthopaedic Surgery

## 2017-10-09 DIAGNOSIS — G4733 Obstructive sleep apnea (adult) (pediatric): Secondary | ICD-10-CM | POA: Insufficient documentation

## 2017-10-09 DIAGNOSIS — F329 Major depressive disorder, single episode, unspecified: Secondary | ICD-10-CM | POA: Insufficient documentation

## 2017-10-09 DIAGNOSIS — M4722 Other spondylosis with radiculopathy, cervical region: Secondary | ICD-10-CM | POA: Insufficient documentation

## 2017-10-09 DIAGNOSIS — M50123 Cervical disc disorder at C6-C7 level with radiculopathy: Secondary | ICD-10-CM | POA: Diagnosis not present

## 2017-10-09 DIAGNOSIS — M4802 Spinal stenosis, cervical region: Secondary | ICD-10-CM | POA: Diagnosis present

## 2017-10-09 DIAGNOSIS — E7800 Pure hypercholesterolemia, unspecified: Secondary | ICD-10-CM | POA: Insufficient documentation

## 2017-10-09 DIAGNOSIS — M5010 Cervical disc disorder with radiculopathy, unspecified cervical region: Secondary | ICD-10-CM | POA: Diagnosis not present

## 2017-10-09 DIAGNOSIS — Z85828 Personal history of other malignant neoplasm of skin: Secondary | ICD-10-CM | POA: Insufficient documentation

## 2017-10-09 DIAGNOSIS — F419 Anxiety disorder, unspecified: Secondary | ICD-10-CM | POA: Insufficient documentation

## 2017-10-09 DIAGNOSIS — Z419 Encounter for procedure for purposes other than remedying health state, unspecified: Secondary | ICD-10-CM

## 2017-10-09 HISTORY — DX: Other cervical disc displacement, unspecified cervical region: M50.20

## 2017-10-09 HISTORY — DX: Spinal stenosis, cervical region: M48.02

## 2017-10-09 HISTORY — PX: ANTERIOR CERVICAL DECOMP/DISCECTOMY FUSION: SHX1161

## 2017-10-09 SURGERY — ANTERIOR CERVICAL DECOMPRESSION/DISCECTOMY FUSION 1 LEVEL
Anesthesia: General | Site: Spine Cervical

## 2017-10-09 MED ORDER — SUGAMMADEX SODIUM 200 MG/2ML IV SOLN
INTRAVENOUS | Status: AC
  Filled 2017-10-09: qty 2

## 2017-10-09 MED ORDER — SODIUM CHLORIDE 0.9% FLUSH: 3.0000 mL | INTRAVENOUS | Status: DC | PRN

## 2017-10-09 MED ORDER — FENTANYL CITRATE (PF) 250 MCG/5ML IJ SOLN
INTRAMUSCULAR | Status: AC
  Filled 2017-10-09: qty 5

## 2017-10-09 MED ORDER — ONDANSETRON HCL 4 MG/2ML IJ SOLN
INTRAMUSCULAR | Status: AC
  Filled 2017-10-09: qty 2

## 2017-10-09 MED ORDER — 0.9 % SODIUM CHLORIDE (POUR BTL) OPTIME
TOPICAL | Status: DC | PRN
  Administered 2017-10-09: 1000 mL

## 2017-10-09 MED ORDER — SUGAMMADEX SODIUM 200 MG/2ML IV SOLN
INTRAVENOUS | Status: DC | PRN
  Administered 2017-10-09: 200 mg via INTRAVENOUS

## 2017-10-09 MED ORDER — LACTATED RINGERS IV SOLN
INTRAVENOUS | Status: DC
  Administered 2017-10-09 (×2): via INTRAVENOUS

## 2017-10-09 MED ORDER — METHOCARBAMOL 500 MG PO TABS
500.0000 mg | ORAL_TABLET | Freq: Four times a day (QID) | ORAL | Status: DC | PRN
  Administered 2017-10-09 – 2017-10-10 (×4): 500 mg via ORAL
  Filled 2017-10-09 (×3): qty 1

## 2017-10-09 MED ORDER — OXYCODONE HCL 5 MG PO TABS
ORAL_TABLET | ORAL | Status: AC
  Filled 2017-10-09: qty 1

## 2017-10-09 MED ORDER — BUPIVACAINE-EPINEPHRINE 0.5% -1:200000 IJ SOLN
INTRAMUSCULAR | Status: DC | PRN
  Administered 2017-10-09: 10 mL

## 2017-10-09 MED ORDER — FENTANYL CITRATE (PF) 100 MCG/2ML IJ SOLN
INTRAMUSCULAR | Status: DC | PRN
  Administered 2017-10-09 (×2): 50 ug via INTRAVENOUS
  Administered 2017-10-09: 100 ug via INTRAVENOUS
  Administered 2017-10-09: 50 ug via INTRAVENOUS

## 2017-10-09 MED ORDER — DULOXETINE HCL 30 MG PO CPEP
60.0000 mg | ORAL_CAPSULE | Freq: Every day | ORAL | Status: DC
  Administered 2017-10-10: 60 mg via ORAL
  Filled 2017-10-09: qty 2

## 2017-10-09 MED ORDER — METHOCARBAMOL 1000 MG/10ML IJ SOLN
500.0000 mg | Freq: Four times a day (QID) | INTRAVENOUS | Status: DC | PRN
  Filled 2017-10-09 (×2): qty 5

## 2017-10-09 MED ORDER — ACETAMINOPHEN 160 MG/5ML PO SOLN: 325.0000 mg | ORAL | Status: DC | PRN

## 2017-10-09 MED ORDER — OXYCODONE-ACETAMINOPHEN 5-325 MG PO TABS
1.0000 | ORAL_TABLET | Freq: Four times a day (QID) | ORAL | 0 refills | Status: DC | PRN
Start: 2017-10-09 — End: 2017-12-25

## 2017-10-09 MED ORDER — ROCURONIUM BROMIDE 10 MG/ML (PF) SYRINGE
PREFILLED_SYRINGE | INTRAVENOUS | Status: AC
  Filled 2017-10-09: qty 5

## 2017-10-09 MED ORDER — ACYCLOVIR 800 MG PO TABS
800.0000 mg | ORAL_TABLET | Freq: Three times a day (TID) | ORAL | Status: DC
  Filled 2017-10-09 (×3): qty 1

## 2017-10-09 MED ORDER — CHLORHEXIDINE GLUCONATE CLOTH 2 % EX PADS
6.0000 | MEDICATED_PAD | Freq: Every day | CUTANEOUS | Status: DC
  Administered 2017-10-09 – 2017-10-10 (×2): 6 via TOPICAL

## 2017-10-09 MED ORDER — ATORVASTATIN CALCIUM 20 MG PO TABS
40.0000 mg | ORAL_TABLET | Freq: Every day | ORAL | Status: DC
  Administered 2017-10-10: 40 mg via ORAL
  Filled 2017-10-09: qty 2

## 2017-10-09 MED ORDER — ACETAMINOPHEN 325 MG PO TABS: 325.0000 mg | ORAL_TABLET | ORAL | Status: DC | PRN

## 2017-10-09 MED ORDER — GABAPENTIN 100 MG PO CAPS
100.0000 mg | ORAL_CAPSULE | Freq: Every day | ORAL | Status: DC
  Administered 2017-10-09: 100 mg via ORAL
  Filled 2017-10-09: qty 1

## 2017-10-09 MED ORDER — ONDANSETRON HCL 4 MG PO TABS: 4.0000 mg | ORAL_TABLET | Freq: Four times a day (QID) | ORAL | Status: DC | PRN

## 2017-10-09 MED ORDER — PROPOFOL 10 MG/ML IV BOLUS
INTRAVENOUS | Status: AC
  Filled 2017-10-09: qty 20

## 2017-10-09 MED ORDER — OXYCODONE HCL 5 MG/5ML PO SOLN: 5.0000 mg | Freq: Once | ORAL | Status: AC | PRN

## 2017-10-09 MED ORDER — DEXAMETHASONE SODIUM PHOSPHATE 10 MG/ML IJ SOLN
INTRAMUSCULAR | Status: DC | PRN
  Administered 2017-10-09: 10 mg via INTRAVENOUS

## 2017-10-09 MED ORDER — ROCURONIUM BROMIDE 100 MG/10ML IV SOLN
INTRAVENOUS | Status: DC | PRN
  Administered 2017-10-09: 60 mg via INTRAVENOUS
  Administered 2017-10-09: 10 mg via INTRAVENOUS

## 2017-10-09 MED ORDER — MIDAZOLAM HCL 2 MG/2ML IJ SOLN
INTRAMUSCULAR | Status: AC
  Filled 2017-10-09: qty 2

## 2017-10-09 MED ORDER — DOCUSATE SODIUM 100 MG PO CAPS
100.0000 mg | ORAL_CAPSULE | Freq: Two times a day (BID) | ORAL | Status: DC
  Administered 2017-10-09 – 2017-10-10 (×2): 100 mg via ORAL
  Filled 2017-10-09 (×2): qty 1

## 2017-10-09 MED ORDER — OXYCODONE HCL 5 MG PO TABS
5.0000 mg | ORAL_TABLET | ORAL | Status: DC | PRN
  Administered 2017-10-09: 5 mg via ORAL
  Filled 2017-10-09: qty 1

## 2017-10-09 MED ORDER — DEXAMETHASONE SODIUM PHOSPHATE 10 MG/ML IJ SOLN
INTRAMUSCULAR | Status: AC
  Filled 2017-10-09: qty 1

## 2017-10-09 MED ORDER — LIDOCAINE HCL (CARDIAC) 20 MG/ML IV SOLN
INTRAVENOUS | Status: AC
  Filled 2017-10-09: qty 5

## 2017-10-09 MED ORDER — PROPOFOL 10 MG/ML IV BOLUS
INTRAVENOUS | Status: DC | PRN
  Administered 2017-10-09: 140 mg via INTRAVENOUS

## 2017-10-09 MED ORDER — OXYCODONE HCL 5 MG PO TABS
5.0000 mg | ORAL_TABLET | Freq: Once | ORAL | Status: AC | PRN
  Administered 2017-10-09: 5 mg via ORAL

## 2017-10-09 MED ORDER — CEFAZOLIN SODIUM-DEXTROSE 2-4 GM/100ML-% IV SOLN
2.0000 g | INTRAVENOUS | Status: AC
  Administered 2017-10-09: 2 g via INTRAVENOUS

## 2017-10-09 MED ORDER — OXYCODONE HCL 5 MG PO TABS
5.0000 mg | ORAL_TABLET | ORAL | Status: DC | PRN
  Administered 2017-10-09 – 2017-10-10 (×6): 5 mg via ORAL
  Filled 2017-10-09 (×6): qty 1

## 2017-10-09 MED ORDER — FENTANYL CITRATE (PF) 100 MCG/2ML IJ SOLN
25.0000 ug | INTRAMUSCULAR | Status: DC | PRN
  Administered 2017-10-09 (×2): 50 ug via INTRAVENOUS

## 2017-10-09 MED ORDER — VANCOMYCIN HCL IN DEXTROSE 1-5 GM/200ML-% IV SOLN
1000.0000 mg | INTRAVENOUS | Status: AC
  Administered 2017-10-09: 1000 mg via INTRAVENOUS
  Filled 2017-10-09: qty 200

## 2017-10-09 MED ORDER — LIDOCAINE HCL (CARDIAC) 20 MG/ML IV SOLN
INTRAVENOUS | Status: DC | PRN
  Administered 2017-10-09: 60 mg via INTRAVENOUS

## 2017-10-09 MED ORDER — METHOCARBAMOL 500 MG PO TABS
ORAL_TABLET | ORAL | Status: AC
  Administered 2017-10-09: 500 mg via ORAL
  Filled 2017-10-09: qty 1

## 2017-10-09 MED ORDER — ALPRAZOLAM 0.5 MG PO TABS
1.0000 mg | ORAL_TABLET | Freq: Every evening | ORAL | Status: DC | PRN
  Administered 2017-10-09: 1 mg via ORAL
  Filled 2017-10-09: qty 2

## 2017-10-09 MED ORDER — ONDANSETRON HCL 4 MG/2ML IJ SOLN
INTRAMUSCULAR | Status: DC | PRN
  Administered 2017-10-09: 4 mg via INTRAVENOUS

## 2017-10-09 MED ORDER — CEFAZOLIN SODIUM-DEXTROSE 2-4 GM/100ML-% IV SOLN
INTRAVENOUS | Status: AC
  Filled 2017-10-09: qty 100

## 2017-10-09 MED ORDER — MUPIROCIN 2 % EX OINT
1.0000 "application " | TOPICAL_OINTMENT | Freq: Two times a day (BID) | CUTANEOUS | Status: DC
  Administered 2017-10-09: 1 via NASAL
  Filled 2017-10-09: qty 22

## 2017-10-09 MED ORDER — RIZATRIPTAN BENZOATE 10 MG PO TBDP
10.0000 mg | ORAL_TABLET | ORAL | Status: DC | PRN
  Filled 2017-10-09: qty 1

## 2017-10-09 MED ORDER — FENTANYL CITRATE (PF) 100 MCG/2ML IJ SOLN
INTRAMUSCULAR | Status: AC
  Administered 2017-10-09: 50 ug via INTRAVENOUS
  Filled 2017-10-09: qty 2

## 2017-10-09 MED ORDER — ACETAMINOPHEN 325 MG PO TABS: 650.0000 mg | ORAL_TABLET | ORAL | Status: DC | PRN

## 2017-10-09 MED ORDER — SODIUM CHLORIDE 0.9% FLUSH
3.0000 mL | Freq: Two times a day (BID) | INTRAVENOUS | Status: DC
  Administered 2017-10-09: 3 mL via INTRAVENOUS

## 2017-10-09 MED ORDER — MENTHOL 3 MG MT LOZG: 1.0000 | LOZENGE | OROMUCOSAL | Status: DC | PRN

## 2017-10-09 MED ORDER — PHENOL 1.4 % MT LIQD: 1.0000 | OROMUCOSAL | Status: DC | PRN

## 2017-10-09 MED ORDER — BUPIVACAINE-EPINEPHRINE (PF) 0.5% -1:200000 IJ SOLN
INTRAMUSCULAR | Status: AC
  Filled 2017-10-09: qty 30

## 2017-10-09 MED ORDER — ACETAMINOPHEN 650 MG RE SUPP: 650.0000 mg | RECTAL | Status: DC | PRN

## 2017-10-09 MED ORDER — SODIUM CHLORIDE 0.9 % IV SOLN: INTRAVENOUS | Status: DC

## 2017-10-09 MED ORDER — CHLORHEXIDINE GLUCONATE 4 % EX LIQD: 60.0000 mL | Freq: Once | CUTANEOUS | Status: DC

## 2017-10-09 MED ORDER — POLYETHYLENE GLYCOL 3350 17 G PO PACK: 17.0000 g | PACK | Freq: Every day | ORAL | Status: DC

## 2017-10-09 MED ORDER — MIDAZOLAM HCL 5 MG/5ML IJ SOLN
INTRAMUSCULAR | Status: DC | PRN
  Administered 2017-10-09: 2 mg via INTRAVENOUS

## 2017-10-09 MED ORDER — ONDANSETRON HCL 4 MG/2ML IJ SOLN: 4.0000 mg | Freq: Four times a day (QID) | INTRAMUSCULAR | Status: DC | PRN

## 2017-10-09 SURGICAL SUPPLY — 51 items
BENZOIN TINCTURE PRP APPL 2/3 (GAUZE/BANDAGES/DRESSINGS) ×2 IMPLANT
BIT DRILL SRG 14X2.2XFLT CHK (BIT) ×1 IMPLANT
BIT DRL SRG 14X2.2XFLT CHK (BIT) ×1
BLADE CLIPPER SURG (BLADE) IMPLANT
BONE CERV LORDOTIC 14.5X12X7 (Bone Implant) ×2 IMPLANT
BUR ROUND FLUTED 4 SOFT TCH (BURR) ×2 IMPLANT
COLLAR CERV LO CONTOUR FIRM DE (SOFTGOODS) ×2 IMPLANT
COVER MAYO STAND STRL (DRAPES) ×2 IMPLANT
COVER SURGICAL LIGHT HANDLE (MISCELLANEOUS) ×2 IMPLANT
CRADLE DONUT ADULT HEAD (MISCELLANEOUS) ×2 IMPLANT
DRAPE C-ARM 42X72 X-RAY (DRAPES) ×2 IMPLANT
DRAPE HALF SHEET 40X57 (DRAPES) ×2 IMPLANT
DRAPE MICROSCOPE LEICA (MISCELLANEOUS) ×2 IMPLANT
DRILL BIT SKYLINE 14MM (BIT) ×1
DURAPREP 6ML APPLICATOR 50/CS (WOUND CARE) ×2 IMPLANT
ELECT COATED BLADE 2.86 ST (ELECTRODE) ×2 IMPLANT
ELECT REM PT RETURN 9FT ADLT (ELECTROSURGICAL) ×2
ELECTRODE REM PT RTRN 9FT ADLT (ELECTROSURGICAL) ×1 IMPLANT
EVACUATOR 1/8 PVC DRAIN (DRAIN) ×2 IMPLANT
GAUZE SPONGE 4X4 12PLY STRL (GAUZE/BANDAGES/DRESSINGS) ×2 IMPLANT
GAUZE SPONGE 4X4 12PLY STRL LF (GAUZE/BANDAGES/DRESSINGS) ×2 IMPLANT
GLOVE BIOGEL PI IND STRL 8 (GLOVE) ×2 IMPLANT
GLOVE BIOGEL PI INDICATOR 8 (GLOVE) ×2
GLOVE ORTHO TXT STRL SZ7.5 (GLOVE) ×4 IMPLANT
GOWN STRL REUS W/ TWL LRG LVL3 (GOWN DISPOSABLE) ×1 IMPLANT
GOWN STRL REUS W/ TWL XL LVL3 (GOWN DISPOSABLE) ×1 IMPLANT
GOWN STRL REUS W/TWL 2XL LVL3 (GOWN DISPOSABLE) ×2 IMPLANT
GOWN STRL REUS W/TWL LRG LVL3 (GOWN DISPOSABLE) ×1
GOWN STRL REUS W/TWL XL LVL3 (GOWN DISPOSABLE) ×1
HEAD HALTER (SOFTGOODS) ×2 IMPLANT
KIT BASIN OR (CUSTOM PROCEDURE TRAY) ×2 IMPLANT
KIT ROOM TURNOVER OR (KITS) ×2 IMPLANT
MANIFOLD NEPTUNE II (INSTRUMENTS) ×2 IMPLANT
NEEDLE 25GX 5/8IN NON SAFETY (NEEDLE) ×2 IMPLANT
NS IRRIG 1000ML POUR BTL (IV SOLUTION) ×2 IMPLANT
PACK ORTHO CERVICAL (CUSTOM PROCEDURE TRAY) ×2 IMPLANT
PAD ARMBOARD 7.5X6 YLW CONV (MISCELLANEOUS) ×4 IMPLANT
PATTIES SURGICAL .5 X.5 (GAUZE/BANDAGES/DRESSINGS) IMPLANT
PLATE ONE LEVEL SKYLINE 14MM (Plate) ×2 IMPLANT
SCREW VAR SELF TAP SKYLINE 14M (Screw) ×8 IMPLANT
STRIP CLOSURE SKIN 1/2X4 (GAUZE/BANDAGES/DRESSINGS) ×2 IMPLANT
SURGIFLO W/THROMBIN 8M KIT (HEMOSTASIS) IMPLANT
SUT BONE WAX W31G (SUTURE) ×2 IMPLANT
SUT VIC AB 3-0 PS2 18 (SUTURE) ×1
SUT VIC AB 3-0 PS2 18XBRD (SUTURE) ×1 IMPLANT
SUT VIC AB 4-0 PS2 27 (SUTURE) ×2 IMPLANT
SYR BULB 3OZ (MISCELLANEOUS) ×2 IMPLANT
TAPE CLOTH SURG 4X10 WHT LF (GAUZE/BANDAGES/DRESSINGS) ×2 IMPLANT
TOWEL OR 17X24 6PK STRL BLUE (TOWEL DISPOSABLE) ×2 IMPLANT
TOWEL OR 17X26 10 PK STRL BLUE (TOWEL DISPOSABLE) ×2 IMPLANT
WATER STERILE IRR 1000ML POUR (IV SOLUTION) ×2 IMPLANT

## 2017-10-09 NOTE — Anesthesia Preprocedure Evaluation (Addendum)
Anesthesia Evaluation  Patient identified by MRN, date of birth, ID band Patient awake    Reviewed: Allergy & Precautions, NPO status , Patient's Chart, lab work & pertinent test results  History of Anesthesia Complications Negative for: history of anesthetic complications  Airway Mallampati: II  TM Distance: >3 FB Neck ROM: Limited    Dental  (+) Teeth Intact, Dental Advisory Given   Pulmonary sleep apnea ,  Occasional use of CPAP   breath sounds clear to auscultation       Cardiovascular negative cardio ROS   Rhythm:Regular Rate:Normal  Stress test 2018  negative   Neuro/Psych  Headaches, PSYCHIATRIC DISORDERS Anxiety Depression  Neuromuscular disease    GI/Hepatic negative GI ROS, Neg liver ROS,   Endo/Other  negative endocrine ROS  Renal/GU negative Renal ROS     Musculoskeletal  (+) Arthritis ,   Abdominal   Peds  Hematology   Anesthesia Other Findings   Reproductive/Obstetrics negative OB ROS                            Anesthesia Physical Anesthesia Plan  ASA: II  Anesthesia Plan: General   Post-op Pain Management:    Induction: Intravenous  PONV Risk Score and Plan: 2 and Ondansetron and Dexamethasone  Airway Management Planned: Video Laryngoscope Planned and Oral ETT  Additional Equipment: None  Intra-op Plan:   Post-operative Plan: Extubation in OR  Informed Consent: I have reviewed the patients History and Physical, chart, labs and discussed the procedure including the risks, benefits and alternatives for the proposed anesthesia with the patient or authorized representative who has indicated his/her understanding and acceptance.   Dental advisory given  Plan Discussed with: CRNA and Surgeon  Anesthesia Plan Comments:         Anesthesia Quick Evaluation

## 2017-10-09 NOTE — Progress Notes (Signed)
Orthopedic Tech Progress Note Patient Details:  John Preston 07/09/1956 [id number]  Ortho Devices Type of Ortho Device: Soft collar Ortho Device/Splint Location: neck Ortho Device/Splint Interventions: Loanne Drilling, Benay Pomeroy 10/09/2017, 12:07 PM

## 2017-10-09 NOTE — Progress Notes (Signed)
Physical Therapy Evaluation Patient Details Name: John Preston MRN: [id number] DOB: 05-12-1956 Today's Date: 10/09/2017   History of Present Illness  Pt is a 62 y/o male s/p C6-7 ACDF. PMH includes basal cell carcinoma, OSA on CPAP, and pre diabetes.   Clinical Impression  Patient is s/p above surgery resulting in the deficits listed below (see PT Problem List). Pt presenting with RUE pain and slightly decreased balance. Required min guard throughout for mobility. Reviewed cervical precautions with pt. Patient will benefit from skilled PT to increase their independence and safety with mobility (while adhering to their precautions) to allow discharge to the venue listed below.     Follow Up Recommendations No PT follow up;Supervision for mobility/OOB    Equipment Recommendations  None recommended by PT    Recommendations for Other Services       Precautions / Restrictions Precautions Precautions: Cervical Precaution Booklet Issued: Yes (comment) Precaution Comments: Reviewed cervical precautions with pt and family.  Required Braces or Orthoses: Cervical Brace Cervical Brace: At all times;Soft collar Restrictions Weight Bearing Restrictions: No      Mobility  Bed Mobility Overal bed mobility: Needs Assistance Bed Mobility: Rolling;Sidelying to Sit;Sit to Sidelying Rolling: Supervision Sidelying to sit: Min guard     Sit to sidelying: Supervision General bed mobility comments: Supervision to min guard to ensure log roll technique. Verbal cues for sequencing.   Transfers Overall transfer level: Needs assistance Equipment used: None Transfers: Sit to/from Stand Sit to Stand: Min guard         General transfer comment: Min guard for safety.   Ambulation/Gait Ambulation/Gait assistance: Min guard Ambulation Distance (Feet): 250 Feet Assistive device: (IV pole ) Gait Pattern/deviations: Step-through pattern;Decreased stride length Gait velocity: WFL  Gait velocity  interpretation: at or above normal speed for age/gender General Gait Details: Slightly unsteady gait requiring min guard throughout for steadying assist. Pt with good gait speed. Educated about generalized walking program to perform at home.   Stairs            Wheelchair Mobility    Modified Rankin (Stroke Patients Only)       Balance Overall balance assessment: Needs assistance Sitting-balance support: No upper extremity supported;Feet supported Sitting balance-Leahy Scale: Good     Standing balance support: Single extremity supported;No upper extremity supported;During functional activity Standing balance-Leahy Scale: Fair Standing balance comment: Static standing without UE support                              Pertinent Vitals/Pain Pain Assessment: 0-10 Pain Score: 8  Pain Location: R arm and neck  Pain Descriptors / Indicators: Constant;Grimacing Pain Intervention(s): Limited activity within patient's tolerance;Monitored during session;Repositioned;Patient requesting pain meds-RN notified    Home Living Family/patient expects to be discharged to:: Private residence Living Arrangements: Spouse/significant other Available Help at Discharge: Family;Available 24 hours/day Type of Home: House Home Access: Stairs to enter Entrance Stairs-Rails: None Entrance Stairs-Number of Steps: 2-3 Home Layout: One level Home Equipment: None      Prior Function Level of Independence: Independent               Hand Dominance        Extremity/Trunk Assessment   Upper Extremity Assessment Upper Extremity Assessment: RUE deficits/detail RUE Deficits / Details: Reports pain in RUE consistent with before surgery.     Lower Extremity Assessment Lower Extremity Assessment: Overall WFL for tasks assessed    Cervical /  Trunk Assessment Cervical / Trunk Assessment: Other exceptions Cervical / Trunk Exceptions: s/p ACDF.   Communication   Communication: No  difficulties  Cognition Arousal/Alertness: Awake/alert Behavior During Therapy: WFL for tasks assessed/performed Overall Cognitive Status: Within Functional Limits for tasks assessed                                        General Comments General comments (skin integrity, edema, etc.): Pt's wife present during session     Exercises     Assessment/Plan    PT Assessment Patient needs continued PT services  PT Problem List Decreased balance;Decreased mobility;Pain;Decreased knowledge of precautions       PT Treatment Interventions Gait training;Stair training;Functional mobility training;Therapeutic activities;Therapeutic exercise;Balance training;Neuromuscular re-education;Patient/family education    PT Goals (Current goals can be found in the Care Plan section)  Acute Rehab PT Goals Patient Stated Goal: to decreased RUE pain  PT Goal Formulation: With patient Time For Goal Achievement: 10/23/17 Potential to Achieve Goals: Good    Frequency Min 5X/week   Barriers to discharge        Co-evaluation               AM-PAC PT "6 Clicks" Daily Activity  Outcome Measure Difficulty turning over in bed (including adjusting bedclothes, sheets and blankets)?: A Little Difficulty moving from lying on back to sitting on the side of the bed? : Unable Difficulty sitting down on and standing up from a chair with arms (e.g., wheelchair, bedside commode, etc,.)?: Unable Help needed moving to and from a bed to chair (including a wheelchair)?: A Little Help needed walking in hospital room?: A Little Help needed climbing 3-5 steps with a railing? : A Little 6 Click Score: 14    End of Session Equipment Utilized During Treatment: Gait belt;Cervical collar Activity Tolerance: Patient tolerated treatment well Patient left: in bed;with call bell/phone within reach;with family/visitor present Nurse Communication: Mobility status;Patient requests pain meds PT Visit  Diagnosis: Unsteadiness on feet (R26.81);Other abnormalities of gait and mobility (R26.89);Pain Pain - Right/Left: Right Pain - part of body: Arm(neck )    Time: 1191-4782 PT Time Calculation (min) (ACUTE ONLY): 23 min   Charges:   PT Evaluation $PT Eval Low Complexity: 1 Low PT Treatments $Gait Training: 8-22 mins   PT G Codes:        Leighton Ruff, PT, DPT  Acute Rehabilitation Services  Pager: [phone]   Rudean Hitt 10/09/2017, 1:13 PM

## 2017-10-09 NOTE — Transfer of Care (Signed)
Immediate Anesthesia Transfer of Care Note  [name]  Procedure(s) Performed: C6-7 ANTERIOR CERVICAL DISCECTOMY & FUSION, ALLOGRAFT, PLATE (N/A Spine Cervical)  Patient Location: PACU  Anesthesia Type:General  Level of Consciousness: awake, oriented and patient cooperative  Airway & Oxygen Therapy: Patient Spontanous Breathing and Patient connected to nasal cannula oxygen  Post-op Assessment: Report given to RN and Post -op Vital signs reviewed and stable  Post vital signs: Reviewed  Last Vitals:  Vitals:   10/09/17 0604 10/09/17 1000  BP: (!) 166/110 (!) 150/106  Pulse: 93 (!) 106  Resp: 18 10  Temp: 36.7 C (!) 36.2 C  SpO2: 97% 90%    Last Pain:  Vitals:   10/09/17 1000  TempSrc:   PainSc: Asleep      Patients Stated Pain Goal: 3 (84/03/75 4360)  Complications: No apparent anesthesia complications

## 2017-10-09 NOTE — Op Note (Signed)
Preop diagnosis: Right C6-7 disc protrusion with radiculopathy.  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  Postop diagnosis: Same  Procedure: C6-7 anterior cervical discectomy and fusion, allograft and plate.  Surgeon: Rodell Perna MD  Assistant: Benjiman Core PA-C medically necessary and present for the entire procedure  Anesthesia: General +6 cc of Marcaine skin local  EBL: Minimal  Procedure: After induction of general anesthesia orotracheal intubation the arms tucked at the side with yellow pads wrist restraints applied for traction for fluoroscopic visualization during the case as needed, head halter traction was applied sterile male standard at the head DuraPrep area squared with towel sterile skin marker Betadine Steri-Drape and thyroid sheets and drapes were applied.  Patient had positive PCR for MRSA and vancomycin and Ancef was given prophylactically.  Timeout procedure was completed.  Incision was made starting at the midline extending to the left.  Platysma was split in line with the skin incision.  Blunt dissection with carotid sheath and contents lateral down to the spur at C6-7.  Short 25 needle with straight clamp and sterilely draped C arm confirmed the appropriate level at C6-7.  Scalpel was used to take a chunk out of the anterior portion of the disc to market and teeth retractor Cloward's were placed right and left smooth plates cephalad and caudad.  Discectomy was performed operative microscope was draped and brought in and the posterior portion of the disc was removed under microscopic visualization.  Spurs were removed right and left with the Cloward curettes and posterior longitudinal ligament was taken down decompressing the dura.  Disc fragments on the right side consistent with radiculopathy were removed.  Good decompression of the dura was performed.  Trial sizers showed 6 was slightly loose 7 mm trial gave nice restoration of disc space height.  Anterior spurs were removed bone wax was applied  anteriorly.  With CRNA pulling on head halter traction submillimeter graft that was marked in the midline was countersunk after some Surgi-Flo was placed in the depth left for 1 minute and then removed.  Epidural space was dry.  Graft was countersunk 1 mm.  14 mm Depuy skyline plate was selected with 14 mm screws x4 confirmed with C arm before screws were inserted to make sure was appropriately placed in the right length.  After screws were placed C-arm confirmed AP lateral and oblique that there was satisfactory placement of graft and screws.  A tiny locking screws were used for the locking mechanism to lock down all 4 screws.  Hemovac drain placed within and out technique in line with skin incision.  Repeat irrigation copiously closure of platysma with 3-0 Vicryl.  4-0 Vicryl subcuticular closure tincture benzoin Steri-Strips Marcaine infiltration the skin 4 x 4's tape and soft cervical collar was applied.  Patient tolerated procedure well was transferred to recovery room in stable condition.

## 2017-10-09 NOTE — Anesthesia Procedure Notes (Signed)
Procedure Name: Intubation Date/Time: 10/09/2017 7:46 AM Performed by: Jenne Campus, CRNA Pre-anesthesia Checklist: Patient identified, Emergency Drugs available, Suction available and Patient being monitored Patient Re-evaluated:Patient Re-evaluated prior to induction Oxygen Delivery Method: Circle System Utilized Preoxygenation: Pre-oxygenation with 100% oxygen Induction Type: IV induction Ventilation: Mask ventilation without difficulty Laryngoscope Size: Glidescope and 4 Grade View: Grade I Tube type: Oral Tube size: 7.5 mm Number of attempts: 1 Airway Equipment and Method: Stylet and Oral airway Placement Confirmation: ETT inserted through vocal cords under direct vision,  positive ETCO2 and breath sounds checked- equal and bilateral Secured at: 22 cm Tube secured with: Tape Dental Injury: Teeth and Oropharynx as per pre-operative assessment

## 2017-10-09 NOTE — Interval H&P Note (Signed)
History and Physical Interval Note:  10/09/2017 7:21 AM  [name]  has presented today for surgery, with the diagnosis of C6-7 SPONDYLOSIS, FORAMINAL STENOSIS, DISC PROTRUSION  The various methods of treatment have been discussed with the patient and family. After consideration of risks, benefits and other options for treatment, the patient has consented to  Procedure(s): C6-7 ANTERIOR CERVICAL DISCECTOMY & FUSION, ALLOGRAFT, PLATE (N/A) as a surgical intervention .  The patient's history has been reviewed, patient examined, no change in status, stable for surgery.  I have reviewed the patient's chart and labs.  Questions were answered to the patient's satisfaction.     John Preston

## 2017-10-10 DIAGNOSIS — M50123 Cervical disc disorder at C6-C7 level with radiculopathy: Secondary | ICD-10-CM | POA: Diagnosis not present

## 2017-10-10 MED ORDER — GABAPENTIN 100 MG PO CAPS: 100.0000 mg | ORAL_CAPSULE | Freq: Every day | ORAL | 0 refills | Status: DC

## 2017-10-10 NOTE — Progress Notes (Signed)
Patient ID: John Preston, male   DOB: 1956/04/30, 62 y.o.   MRN: [id number] Doing very well.  Can go home today. Great strength bilateral upper extremity.  Can be discharged to home today.

## 2017-10-10 NOTE — Progress Notes (Signed)
Physical Therapy Treatment Patient Details Name: John Preston MRN: [id number] DOB: 1955-08-16 Today's Date: 10/10/2017    History of Present Illness Pt is a 62 y/o male s/p C6-7 ACDF. PMH includes basal cell carcinoma, OSA on CPAP, and pre diabetes.     PT Comments    Pt making great progress with functional mobility and successfully completed stair training this session. PT reviewed general walking program with pt to initiate upon d/c. PT also reviewed cervical precautions with pt throughout. PT will continue to follow acutely to ensure a safe d/c home.    Follow Up Recommendations  Preston PT follow up;Supervision for mobility/OOB     Equipment Recommendations  None recommended by PT    Recommendations for Other Services       Precautions / Restrictions Precautions Precautions: Cervical Precaution Comments: Reviewed cervical precautions with pt throughout Required Braces or Orthoses: Cervical Brace Cervical Brace: At all times;Soft collar Restrictions Weight Bearing Restrictions: Preston    Mobility  Bed Mobility Overal bed mobility: Needs Assistance Bed Mobility: Rolling;Sidelying to Sit;Sit to Sidelying Rolling: Supervision Sidelying to sit: Supervision     Sit to sidelying: Supervision General bed mobility comments: supervision for safety, cueing for log roll technique  Transfers Overall transfer level: Needs assistance Equipment used: None Transfers: Sit to/from Stand Sit to Stand: Supervision         General transfer comment: supervision for safety  Ambulation/Gait Ambulation/Gait assistance: Supervision Ambulation Distance (Feet): 400 Feet Assistive device: None Gait Pattern/deviations: Step-through pattern;Decreased stride length Gait velocity: WFL  Gait velocity interpretation: at or above normal speed for age/gender General Gait Details: steady gait, able to fluctuate speed with Preston issues, Preston LOB or need for physical assistance   Stairs Stairs: Yes    Stair Management: One rail Left;Alternating pattern;Forwards Number of Stairs: 10    Wheelchair Mobility    Modified Rankin (Stroke Patients Only)       Balance Overall balance assessment: Needs assistance Sitting-balance support: Preston upper extremity supported;Feet supported Sitting balance-Leahy Scale: Good     Standing balance support: Preston upper extremity supported;During functional activity Standing balance-Leahy Scale: Good                              Cognition Arousal/Alertness: Awake/alert Behavior During Therapy: WFL for tasks assessed/performed Overall Cognitive Status: Within Functional Limits for tasks assessed                                        Exercises      General Comments        Pertinent Vitals/Pain Pain Assessment: Faces Faces Pain Scale: Hurts little more Pain Location: neck Pain Descriptors / Indicators: Sore Pain Intervention(s): Monitored during session;Repositioned    Home Living                      Prior Function            PT Goals (current goals can now be found in the care plan section) Acute Rehab PT Goals PT Goal Formulation: With patient Time For Goal Achievement: 10/23/17 Potential to Achieve Goals: Good Progress towards PT goals: Progressing toward goals    Frequency    Min 5X/week      PT Plan Current plan remains appropriate    Co-evaluation  AM-PAC PT "6 Clicks" Daily Activity  Outcome Measure  Difficulty turning over in bed (including adjusting bedclothes, sheets and blankets)?: None Difficulty moving from lying on back to sitting on the side of the bed? : None Difficulty sitting down on and standing up from a chair with arms (e.g., wheelchair, bedside commode, etc,.)?: None Help needed moving to and from a bed to chair (including a wheelchair)?: None Help needed walking in hospital room?: None Help needed climbing 3-5 steps with a railing? :  None 6 Click Score: 24    End of Session Equipment Utilized During Treatment: Cervical collar Activity Tolerance: Patient tolerated treatment well Patient left: in bed;with call bell/phone within reach Nurse Communication: Mobility status PT Visit Diagnosis: Other abnormalities of gait and mobility (R26.89)     Time: 8421-0312 PT Time Calculation (min) (ACUTE ONLY): 20 min  Charges:  $Gait Training: 8-22 mins                    G Codes:       [place], [place], [place] 10/10/2017, 8:54 AM

## 2017-10-10 NOTE — Anesthesia Postprocedure Evaluation (Signed)
Anesthesia Post Note  Patient: ADONYS WILDES  Procedure(s) Performed: C6-7 ANTERIOR CERVICAL DISCECTOMY & FUSION, ALLOGRAFT, PLATE (N/A Spine Cervical)     Patient location during evaluation: PACU Anesthesia Type: General Level of consciousness: awake and alert Pain management: pain level controlled Vital Signs Assessment: post-procedure vital signs reviewed and stable Respiratory status: spontaneous breathing, nonlabored ventilation, respiratory function stable and patient connected to nasal cannula oxygen Cardiovascular status: blood pressure returned to baseline and stable Postop Assessment: no apparent nausea or vomiting Anesthetic complications: no    Last Vitals:  Vitals:   10/10/17 0400 10/10/17 0753  BP: (!) 155/97 139/88  Pulse: (!) 101 95  Resp: 18 18  Temp: 36.5 C 36.6 C  SpO2: 96% 99%    Last Pain:  Vitals:   10/10/17 0753  TempSrc: Oral  PainSc:                  Leevi Cullars

## 2017-10-10 NOTE — Progress Notes (Signed)
Patient alert and oriented, mae's well, voiding adequate amount of urine, swallowing without difficulty,  c/o mild pain at time of discharge. Patient discharged home with family. Script and discharged instructions given to patient. Patient and family stated understanding of instructions given. Patient has an appointment with Dr. Yates   

## 2017-10-10 NOTE — Evaluation (Signed)
Occupational Therapy Evaluation and Discharge Patient Details Name: John Preston MRN: [id number] DOB: 28-Jul-1956 Today's Date: 10/10/2017    History of Present Illness Pt is a 62 y/o male s/p C6-7 ACDF. PMH includes basal cell carcinoma, OSA on CPAP, and pre diabetes.    Clinical Impression   This 62 yo male admitted and underwent above presents to acute OT with all education completed, we will D/C from acute OT.    Follow Up Recommendations  No OT follow up    Equipment Recommendations  None recommended by OT       Precautions / Restrictions Precautions Precautions: Cervical Precaution Booklet Issued: Yes (comment) Precaution Comments: Reviewed cervical precautions with pt  Required Braces or Orthoses: Cervical Brace Cervical Brace: At all times;Soft collar(except can have off for eating and mouth care; must shower in 2nd collar covered with bag or saran wrap to keep dry) Restrictions Weight Bearing Restrictions: No             ADL either performed or assessed with clinical judgement   ADL                                         General ADL Comments: Educated pt on when he could have collar off (eating and mouth care), showering in 2nd collar he has--covering with bag or saran wrap to keep it dry, clothing easier to wear for UB, not bending over thus putting pressure on his neck, for LBD bringing his feet up to him, using 2 cups for brushing teeth (one to spit and one to rinse with straw), using cups only with straws to avoid tipping head back when drinking     Vision Patient Visual Report: No change from baseline              Pertinent Vitals/Pain Pain Assessment: 0-10 Faces Pain Scale: Hurts a little bit Pain Location: neck Pain Descriptors / Indicators: Sore Pain Intervention(s): Monitored during session     Hand Dominance Right   Extremity/Trunk Assessment Upper Extremity Assessment Upper Extremity Assessment: RUE deficits/detail RUE  Deficits / Details: Reports still has a little pain in arm when up and about, but dissipates when he is laying down           Communication Communication Communication: No difficulties   Cognition Arousal/Alertness: Awake/alert Behavior During Therapy: WFL for tasks assessed/performed Overall Cognitive Status: Within Functional Limits for tasks assessed                                                Home Living Family/patient expects to be discharged to:: Private residence Living Arrangements: Spouse/significant other Available Help at Discharge: Family;Available 24 hours/day Type of Home: House Home Access: Stairs to enter [employer] of Steps: 2-3 Entrance Stairs-Rails: None Home Layout: One level     Bathroom Shower/Tub: Occupational psychologist: Standard     Home Equipment: Hand held shower head          Prior Functioning/Environment Level of Independence: Independent                 OT Problem List: Pain         OT Goals(Current goals can be found in the care plan section) Acute Rehab  OT Goals Patient Stated Goal: home today  OT Frequency:                AM-PAC PT "6 Clicks" Daily Activity     Outcome Measure Help from another person eating meals?: None Help from another person taking care of personal grooming?: None Help from another person toileting, which includes using toliet, bedpan, or urinal?: None Help from another person bathing (including washing, rinsing, drying)?: None Help from another person to put on and taking off regular upper body clothing?: None Help from another person to put on and taking off regular lower body clothing?: None 6 Click Score: 24   End of Session Nurse Communication: (pt ready to go from therapy standpoint)  Activity Tolerance: Patient tolerated treatment well Patient left: in bed;with call bell/phone within reach  OT Visit Diagnosis: Pain Pain - Right/Left:  Right Pain - part of body: Arm(neck)                Time: 3818-4037 OT Time Calculation (min): 22 min Charges:  OT General Charges $OT Visit: 1 Visit OT Evaluation $OT Eval Moderate Complexity: [place], [place] [phone] 10/10/2017

## 2017-10-12 ENCOUNTER — Encounter (HOSPITAL_COMMUNITY): Payer: Self-pay | Admitting: Orthopaedic Surgery

## 2017-10-13 ENCOUNTER — Ambulatory Visit (INDEPENDENT_AMBULATORY_CARE_PROVIDER_SITE_OTHER): Payer: Self-pay | Admitting: Orthopaedic Surgery

## 2017-10-16 ENCOUNTER — Encounter (INDEPENDENT_AMBULATORY_CARE_PROVIDER_SITE_OTHER): Payer: Self-pay | Admitting: Orthopaedic Surgery

## 2017-10-16 ENCOUNTER — Ambulatory Visit (INDEPENDENT_AMBULATORY_CARE_PROVIDER_SITE_OTHER): Payer: BLUE CROSS/BLUE SHIELD | Admitting: Orthopaedic Surgery

## 2017-10-16 ENCOUNTER — Ambulatory Visit (INDEPENDENT_AMBULATORY_CARE_PROVIDER_SITE_OTHER): Payer: BLUE CROSS/BLUE SHIELD

## 2017-10-16 VITALS — BP 131/90 | HR 105

## 2017-10-16 DIAGNOSIS — Z981 Arthrodesis status: Secondary | ICD-10-CM | POA: Diagnosis not present

## 2017-10-16 NOTE — Progress Notes (Signed)
Post-Op Visit Note   [name]           Date of Birth: 08/09/1955           MRN: [id number] Visit Date: 10/16/2017 PCP: Wardell Honour, MD   Assessment & Plan: Postop C6-7 fusion.  Incision looks good Steri-Strips changed.  X-rays show good position.  He had continued pain in his arm after the surgery but probably at least half of the pain is gone already and should continue to improve.  He had severe pain before the procedure in his arm.  Positive EMGs nerve conduction velocities.  Patient is happy with the surgical result recheck 5 weeks for flexion-extension x-rays out of collar.  Chief Complaint:  Chief Complaint  Patient presents with  . Neck - Routine Post Op   Visit Diagnoses:  1. Status post cervical spinal fusion     Plan: Return in 5 weeks for lateral flexion-extension C-spine x-ray.  Follow-Up Instructions: No Follow-up on file.   Orders:  Orders Placed This Encounter  Procedures  . XR Cervical Spine 2 or 3 views   No orders of the defined types were placed in this encounter.   Imaging: Xr Cervical Spine 2 Or 3 Views  Result Date: 10/16/2017 AP and lateral C-spine x-rays obtained and reviewed this shows cervical fusion C6-7 good position of graft plate and screws. Impression: Satisfactory single level C6-7 fusion   PMFS History: Patient Active Problem List   Diagnosis Date Noted  . Cervical spinal stenosis 10/09/2017  . Right arm pain 09/16/2017  . Cervical radiculopathy 09/16/2017  . Neck pain 09/16/2017  . Abnormal electrocardiogram (ECG) (EKG) 03/01/2017  . Corporo-venous occlusive erectile dysfunction 03/01/2017  . Migraine 05/28/2015  . OSA on CPAP 09/27/2014  . Insomnia 03/06/2014  . Pure hypercholesterolemia 01/20/2013  . Other abnormal glucose 01/20/2013  . Generalized anxiety disorder 01/20/2013  . DDD (degenerative disc disease), lumbar 01/20/2013   Past Medical History:  Diagnosis Date  . Anxiety state, unspecified   .  Arthritis   . Basal cell carcinoma of skin, site unspecified    L nasal; [facility].  . Cervical spinal stenosis   . Depression   . Encounter for therapeutic drug monitoring   . Genital herpes   . Genital herpes, unspecified   . HNP (herniated nucleus pulposus), cervical   . Insomnia, unspecified   . Internal hemorrhoids    colonoscopy 08/04/2008.  [facility].  . Internal hemorrhoids without mention of complication   . Lumbago   . 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 OSA on CPAP 09/27/2014  . Other specified inflammatory disease of prostate    outlet obstruction with BPH.Urology consult 2006 [facility]  . Other specified viral warts   . Pain in joint, shoulder region   . Pre-diabetes   . Pure hypercholesterolemia   . Sleep apnea   . Sleep apnea   . Sleep related headaches 06/22/2014  . Unspecified hearing loss   . Unspecified vitamin D deficiency     Family History  Problem Relation Age of Onset  . COPD Mother   . Diabetes Mother   . Heart disease Mother 102       cardiac stenting/CAD; no AMI  . Cancer Maternal Grandmother   . Diabetes Maternal Grandmother   . Tics Father   . Dementia Father   . Mental illness Father   . Lung disease Unknown        Oxygen dependent    Past Surgical  History:  Procedure Laterality Date  . ANTERIOR CERVICAL DECOMP/DISCECTOMY FUSION N/A 10/09/2017   Procedure: C6-7 ANTERIOR CERVICAL DISCECTOMY & FUSION, ALLOGRAFT, PLATE;  Surgeon: Marybelle Killings, MD;  Location: [facility];  Service: Orthopedics;  Laterality: N/A;  . COLONOSCOPY  08/04/2008   internal hemorrhoids; repeat in 10 years.  [facility].  [facility] Kitchen KNEE SURGERY  1991  . L. facial basal cell carcinoma resection  11/2010  . PROSTATE SURGERY  2010   for BPH  . VASECTOMY     Social History   Occupational History  . Occupation: Sales  Tobacco Use  . Smoking status: Never Smoker  . Smokeless tobacco: Never Used  Substance and Sexual Activity  . Alcohol use: Yes    Alcohol/week: 9.0 oz    Types:  15 Glasses of wine per week    Comment: moderate 1-2 glasses of wine per day; more on weekends.  . Drug use: No    Comment: prevoiusly used marijuana  . Sexual activity: Yes    Birth control/protection: Post-menopausal

## 2017-10-25 NOTE — Discharge Summary (Signed)
Patient ID: John Preston MRN: [id number] DOB/AGE: Apr 24, 1956 62 y.o.  Admit date: 10/09/2017 Discharge date: 10/25/2017  Admission Diagnoses:  Active Problems:   Cervical spinal stenosis   Discharge Diagnoses:  Active Problems:   Cervical spinal stenosis  status post Procedure(s): C6-7 ANTERIOR CERVICAL DISCECTOMY & FUSION, ALLOGRAFT, PLATE  Past Medical History:  Diagnosis Date  . Anxiety state, unspecified   . Arthritis   . Basal cell carcinoma of skin, site unspecified    L nasal; [facility].  . Cervical spinal stenosis   . Depression   . Encounter for therapeutic drug monitoring   . Genital herpes   . Genital herpes, unspecified   . HNP (herniated nucleus pulposus), cervical   . Insomnia, unspecified   . Internal hemorrhoids    colonoscopy 08/04/2008.  [facility].  . Internal hemorrhoids without mention of complication   . Lumbago   . 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 OSA on CPAP 09/27/2014  . Other specified inflammatory disease of prostate    outlet obstruction with BPH.Urology consult 2006 [facility]  . Other specified viral warts   . Pain in joint, shoulder region   . Pre-diabetes   . Pure hypercholesterolemia   . Sleep apnea   . Sleep apnea   . Sleep related headaches 06/22/2014  . Unspecified hearing loss   . Unspecified vitamin D deficiency     Surgeries: Procedure(s): C6-7 ANTERIOR CERVICAL DISCECTOMY & FUSION, ALLOGRAFT, PLATE on 02/04/1286   Consultants:   Discharged Condition: Improved  Hospital Course: John Preston is an 62 y.o. male who was admitted 10/09/2017 for operative treatment of cervical stenosis. Patient failed conservative treatments (please see the history and physical for the specifics) and had severe unremitting pain that affects sleep, daily activities and work/hobbies. After pre-op clearance, the patient was taken to the operating room on 10/09/2017 and underwent  Procedure(s): C6-7 ANTERIOR CERVICAL DISCECTOMY & FUSION,  ALLOGRAFT, PLATE.    Patient was given perioperative antibiotics:  Anti-infectives (From admission, onward)   Start     Dose/Rate Route Frequency Ordered Stop   10/09/17 1200  acyclovir (ZOVIRAX) tablet 800 mg  Status:  Discontinued     800 mg Oral 3 times daily 10/09/17 1152 10/09/17 1640   10/09/17 0600  vancomycin (VANCOCIN) IVPB 1000 mg/200 mL premix     1,000 mg 200 mL/hr over 60 Minutes Intravenous 60 min pre-op 10/09/17 0558 10/09/17 0743   10/09/17 0559  ceFAZolin (ANCEF) IVPB 2g/100 mL premix     2 g 200 mL/hr over 30 Minutes Intravenous On call to O.R. 10/09/17 0559 10/09/17 0800       Patient was given sequential compression devices and early ambulation to prevent DVT.   Patient benefited maximally from hospital stay and there were no complications. At the time of discharge, the patient was urinating/moving their bowels without difficulty, tolerating a regular diet, pain is controlled with oral pain medications and they have been cleared by PT/OT.   Recent vital signs: No data found.   Recent laboratory studies: No results for input(s): WBC, HGB, HCT, PLT, NA, K, CL, CO2, BUN, CREATININE, GLUCOSE, INR, CALCIUM in the last 72 hours.  Invalid input(s): PT, 2   Discharge Medications:   Allergies as of 10/10/2017   No Known Allergies     Medication List    STOP taking these medications   aspirin EC 81 MG tablet   HYDROcodone-acetaminophen 5-325 MG tablet Commonly known as:  NORCO/VICODIN   ibuprofen 200 MG tablet  Commonly known as:  ADVIL,MOTRIN   indomethacin 25 MG capsule Commonly known as:  INDOCIN   MULTIVITAMIN PO   sildenafil 100 MG tablet Commonly known as:  VIAGRA     TAKE these medications   acyclovir 800 MG tablet Commonly known as:  ZOVIRAX Take 1 tablet (800 mg total) by mouth 3 (three) times daily. What changed:    when to take this  reasons to take this   ALPRAZolam 1 MG tablet Commonly known as:  XANAX Take 1 tablet (1 mg total) by  mouth at bedtime as needed for anxiety.   atorvastatin 40 MG tablet Commonly known as:  LIPITOR Take 1 tablet (40 mg total) by mouth daily.   DULoxetine 60 MG capsule Commonly known as:  CYMBALTA Take 1 capsule (60 mg total) by mouth daily.   gabapentin 100 MG capsule Commonly known as:  NEURONTIN Take 1 capsule (100 mg total) by mouth at bedtime.   oxyCODONE-acetaminophen 5-325 MG tablet Commonly known as:  PERCOCET/ROXICET Take 1 tablet by mouth every 6 (six) hours as needed for severe pain.   rizatriptan 10 MG disintegrating tablet Commonly known as:  MAXALT-MLT Take 1 tablet (10 mg total) by mouth as needed for migraine. May repeat in 2 hours if needed       Diagnostic Studies: Dg Cervical Spine 2-3 Views  Result Date: 10/09/2017 CLINICAL DATA:  C6-7 anterior cervical diskectomy and fusion. EXAM: DG C-ARM 61-120 MIN; CERVICAL SPINE - 2-3 VIEW COMPARISON:  None. FINDINGS: AP and lateral intraoperative fluoroscopic images are provided showing anterior cervical fusion hardware in the lower cervical spine. Hardware appears intact and appropriately positioned. Fluoroscopy was provided for 11 seconds. IMPRESSION: Intraoperative images demonstrating anterior cervical fusion hardware in the lower cervical spine. No evidence of surgical complicating feature. Electronically Signed   By: Franki Cabot M.D.   On: 10/09/2017 10:20   Dg C-arm 1-60 Min  Result Date: 10/09/2017 CLINICAL DATA:  C6-7 anterior cervical diskectomy and fusion. EXAM: DG C-ARM 61-120 MIN; CERVICAL SPINE - 2-3 VIEW COMPARISON:  None. FINDINGS: AP and lateral intraoperative fluoroscopic images are provided showing anterior cervical fusion hardware in the lower cervical spine. Hardware appears intact and appropriately positioned. Fluoroscopy was provided for 11 seconds. IMPRESSION: Intraoperative images demonstrating anterior cervical fusion hardware in the lower cervical spine. No evidence of surgical complicating feature.  Electronically Signed   By: Franki Cabot M.D.   On: 10/09/2017 10:20   Xr Cervical Spine 2 Or 3 Views  Result Date: 10/16/2017 AP and lateral C-spine x-rays obtained and reviewed this shows cervical fusion C6-7 good position of graft plate and screws. Impression: Satisfactory single level C6-7 fusion   Discharge Instructions    Incentive spirometry RT   Complete by:  As directed       Follow-up Information    Schedule an appointment as soon as possible for a visit with Marybelle Killings, MD.   Specialty:  Orthopedic Surgery Why:  need return  Contact information: [place] [address] [phone]           Discharge Plan:  discharge to home  Disposition:     Signed: Benjiman Core for Rodell Perna MD 10/25/2017, 12:04 PM

## 2017-11-03 ENCOUNTER — Other Ambulatory Visit (INDEPENDENT_AMBULATORY_CARE_PROVIDER_SITE_OTHER): Payer: Self-pay

## 2017-11-03 MED ORDER — GABAPENTIN 100 MG PO CAPS: 100.0000 mg | ORAL_CAPSULE | Freq: Every day | ORAL | 0 refills | Status: DC

## 2017-11-04 ENCOUNTER — Telehealth (INDEPENDENT_AMBULATORY_CARE_PROVIDER_SITE_OTHER): Payer: Self-pay | Admitting: Orthopaedic Surgery

## 2017-11-04 NOTE — Telephone Encounter (Signed)
Med refill  Oxycodone -acetaminophen

## 2017-11-04 NOTE — Telephone Encounter (Signed)
Please advise 

## 2017-11-05 ENCOUNTER — Telehealth (INDEPENDENT_AMBULATORY_CARE_PROVIDER_SITE_OTHER): Payer: Self-pay | Admitting: Orthopaedic Surgery

## 2017-11-05 MED ORDER — HYDROCODONE-ACETAMINOPHEN 10-325 MG PO TABS: 1.0000 | ORAL_TABLET | Freq: Four times a day (QID) | ORAL | 0 refills | Status: DC | PRN

## 2017-11-05 NOTE — Telephone Encounter (Signed)
Per Jeneen Rinks ok for Norco 10-325 1 po q 6 hrs prn pain #50 with no refills.

## 2017-11-05 NOTE — Telephone Encounter (Signed)
Duplicate note in chart sent to Benjiman Core, PA-C yesterday. Awaiting response.

## 2017-11-05 NOTE — Telephone Encounter (Signed)
Patient called again checking on his rx, he said he has only been taking this medication at night but it really helps him get comfortable to be able to sleep. He did not have it last night and said he was struggling with the pain. He really would like to pick up the prescription today. I did tell him that Dr. Lorin Mercy has been out of the office and that we were working on his PA to get it written. Hes upset that he has called yesterday and today and has not gotten a call back. CB #  [phone]

## 2017-11-05 NOTE — Telephone Encounter (Signed)
I called patient and advised. Script will be at front for pick up. I reminded him to stay in the collar at all times until follow up appt and at that time we will do x-rays to see if he can come out of the collar. He states that he is having some trouble with swallowing still and the muscles around the incision are sensitive. The actual incision is doing well. He does have some pain when he flexes or extends his neck. I advised to keep collar on at all times until follow up.  He expressed understanding.

## 2017-11-05 NOTE — Telephone Encounter (Signed)
Patient spouse called to request refill Oxycodone. Patient not sleeping.  Please call spouse to advise

## 2017-11-18 ENCOUNTER — Encounter (INDEPENDENT_AMBULATORY_CARE_PROVIDER_SITE_OTHER): Payer: Self-pay | Admitting: Orthopaedic Surgery

## 2017-11-18 ENCOUNTER — Ambulatory Visit (INDEPENDENT_AMBULATORY_CARE_PROVIDER_SITE_OTHER): Payer: BLUE CROSS/BLUE SHIELD | Admitting: Orthopaedic Surgery

## 2017-11-18 ENCOUNTER — Ambulatory Visit (INDEPENDENT_AMBULATORY_CARE_PROVIDER_SITE_OTHER): Payer: BLUE CROSS/BLUE SHIELD

## 2017-11-18 DIAGNOSIS — M542 Cervicalgia: Secondary | ICD-10-CM | POA: Diagnosis not present

## 2017-11-18 NOTE — Progress Notes (Signed)
Post-Op Visit Note   [name]           Date of Birth: 05-14-1956           MRN: [id number] Visit Date: 11/18/2017 PCP: Wardell Honour, MD   Assessment & Plan: Post C6-7 single level cervical fusion.  Discontinue collar he is okay to drive he can gradually resume normal activities.  I will check him back again for likely final visit in 1 month.  He still notes he has some numbness and tingling in his arm but does not have pain.  Strength is improved.  Chief Complaint:  Chief Complaint  Patient presents with  . Neck - Routine Post Op   Visit Diagnoses:  1. Neck pain     Plan: Return in 1 month.  Gradual resumption of normal activities.  Good relief of arm pain.  Follow-Up Instructions: Return in about 1 month (around 12/18/2017).   Orders:  Orders Placed This Encounter  Procedures  . XR Cervical Spine 2 or 3 views   No orders of the defined types were placed in this encounter.   Imaging: Xr Cervical Spine 2 Or 3 Views  Result Date: 11/18/2017 AP lateral cervical spine x-rays lateral flexion-extension x-ray demonstrates C6-7 fusion.  1-2 mm motion posteriorly on flexion-extension without evidence of motion of the graft.  There appears to be partial consolidation of the graft. Impression: Satisfactory postop C6-7 fusion.  Progressive incorporation is demonstrated radiographically.   PMFS History: Patient Active Problem List   Diagnosis Date Noted  . Cervical spinal stenosis 10/09/2017  . Right arm pain 09/16/2017  . Cervical radiculopathy 09/16/2017  . Neck pain 09/16/2017  . Abnormal electrocardiogram (ECG) (EKG) 03/01/2017  . Corporo-venous occlusive erectile dysfunction 03/01/2017  . Migraine 05/28/2015  . OSA on CPAP 09/27/2014  . Insomnia 03/06/2014  . Pure hypercholesterolemia 01/20/2013  . Other abnormal glucose 01/20/2013  . Generalized anxiety disorder 01/20/2013  . DDD (degenerative disc disease), lumbar 01/20/2013   Past Medical History:   Diagnosis Date  . Anxiety state, unspecified   . Arthritis   . Basal cell carcinoma of skin, site unspecified    L nasal; [facility].  . Cervical spinal stenosis   . Depression   . Encounter for therapeutic drug monitoring   . Genital herpes   . Genital herpes, unspecified   . HNP (herniated nucleus pulposus), cervical   . Insomnia, unspecified   . Internal hemorrhoids    colonoscopy 08/04/2008.  [facility].  . Internal hemorrhoids without mention of complication   . Lumbago   . 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 OSA on CPAP 09/27/2014  . Other specified inflammatory disease of prostate    outlet obstruction with BPH.Urology consult 2006 [facility]  . Other specified viral warts   . Pain in joint, shoulder region   . Pre-diabetes   . Pure hypercholesterolemia   . Sleep apnea   . Sleep apnea   . Sleep related headaches 06/22/2014  . Unspecified hearing loss   . Unspecified vitamin D deficiency     Family History  Problem Relation Age of Onset  . COPD Mother   . Diabetes Mother   . Heart disease Mother 89       cardiac stenting/CAD; no AMI  . Cancer Maternal Grandmother   . Diabetes Maternal Grandmother   . Tics Father   . Dementia Father   . Mental illness Father   . Lung disease Unknown  Oxygen dependent    Past Surgical History:  Procedure Laterality Date  . ANTERIOR CERVICAL DECOMP/DISCECTOMY FUSION N/A 10/09/2017   Procedure: C6-7 ANTERIOR CERVICAL DISCECTOMY & FUSION, ALLOGRAFT, PLATE;  Surgeon: Marybelle Killings, MD;  Location: [facility];  Service: Orthopedics;  Laterality: N/A;  . COLONOSCOPY  08/04/2008   internal hemorrhoids; repeat in 10 years.  [facility].  Marland Kitchen KNEE SURGERY  1991  . L. facial basal cell carcinoma resection  11/2010  . PROSTATE SURGERY  2010   for BPH  . VASECTOMY     Social History   Occupational History  . Occupation: Sales  Tobacco Use  . Smoking status: Never Smoker  . Smokeless tobacco: Never Used  Substance and Sexual Activity  .  Alcohol use: Yes    Alcohol/week: 9.0 oz    Types: 15 Glasses of wine per week    Comment: moderate 1-2 glasses of wine per day; more on weekends.  . Drug use: No    Comment: prevoiusly used marijuana  . Sexual activity: Yes    Birth control/protection: Post-menopausal

## 2017-12-01 ENCOUNTER — Telehealth: Payer: Self-pay | Admitting: Family Medicine

## 2017-12-01 MED ORDER — DULOXETINE HCL 60 MG PO CPEP
60.0000 mg | ORAL_CAPSULE | Freq: Two times a day (BID) | ORAL | 1 refills | Status: DC
Start: 2017-12-01 — End: 2018-02-15

## 2017-12-01 NOTE — Telephone Encounter (Signed)
Cymbalta increased to two tablets daily.  Please advise patient.

## 2017-12-01 NOTE — Telephone Encounter (Signed)
Called and spoke with Mr. John Preston this afternoon and he informed me that after his surgery he was feeling more down and depressed. He states taking 2 tablets a day is helping with the depression and sad moods.

## 2017-12-01 NOTE — Telephone Encounter (Signed)
Copied from [place] [id number]. Topic: Quick Communication - See Telephone Encounter >> Dec 01, 2017 11:03 AM Preston, John Emperor wrote: CRM for notification. See Telephone encounter for: 12/01/17.  Pt would like to know if Dr. Tamala Julian would increase DULoxetine (CYMBALTA) 60 MG capsule [[id number]] back to 120 MG. He states he has been taking two pills a day now for 2-3 weeks now.  [id number] - [place], [place] [phone] (Phone) [phone] (Fax)

## 2017-12-01 NOTE — Telephone Encounter (Signed)
Please advise 

## 2017-12-01 NOTE — Telephone Encounter (Signed)
Please call --- what symptoms is patient having to need to increase Cymbalta?

## 2017-12-16 ENCOUNTER — Ambulatory Visit (INDEPENDENT_AMBULATORY_CARE_PROVIDER_SITE_OTHER): Payer: BLUE CROSS/BLUE SHIELD | Admitting: Orthopaedic Surgery

## 2017-12-16 ENCOUNTER — Encounter (INDEPENDENT_AMBULATORY_CARE_PROVIDER_SITE_OTHER): Payer: Self-pay | Admitting: Orthopaedic Surgery

## 2017-12-16 ENCOUNTER — Ambulatory Visit (INDEPENDENT_AMBULATORY_CARE_PROVIDER_SITE_OTHER): Payer: BLUE CROSS/BLUE SHIELD

## 2017-12-16 VITALS — BP 119/85 | HR 82

## 2017-12-16 DIAGNOSIS — Z981 Arthrodesis status: Secondary | ICD-10-CM | POA: Diagnosis not present

## 2017-12-16 NOTE — Progress Notes (Signed)
Post-Op Visit Note   [name]           Date of Birth: 03-May-1956           MRN: [id number] Visit Date: 12/16/2017 PCP: Wardell Honour, MD   Assessment & Plan: Patient still notes some tingling or numbness in his index and long finger but no pain.  Full range of motion of shoulder neck range of motion is improved no neck pain or shoulder pain.  He is very happy with the surgical result and is back driving.  Chief Complaint:  Chief Complaint  Patient presents with  . Neck - Follow-up, Routine Post Op   Visit Diagnoses:  1. Status post cervical spinal fusion     Plan: We will release him from care patient is happy with the results from surgery.  He had slight disc dehydration with minimal bulge at C5-6 one level above the level that was fused.  He can return on an as-needed basis.  Follow-Up Instructions: No follow-ups on file.   Orders:  Orders Placed This Encounter  Procedures  . XR Cervical Spine 2 or 3 views   No orders of the defined types were placed in this encounter.   Imaging: Xr Cervical Spine 2 Or 3 Views  Result Date: 12/16/2017 AP lateral cervical spine x-rays are obtained and reviewed including lateral flexion-extension x-ray.  There is progressive incorporation of the graft no evidence of loosening of the screws.  Still slight changes in the space between the posterior spinous process with flexion. Impression: Satisfactory postop C6-7 fusion.   PMFS History: Patient Active Problem List   Diagnosis Date Noted  . Cervical spinal stenosis 10/09/2017  . Right arm pain 09/16/2017  . Cervical radiculopathy 09/16/2017  . Neck pain 09/16/2017  . Abnormal electrocardiogram (ECG) (EKG) 03/01/2017  . Corporo-venous occlusive erectile dysfunction 03/01/2017  . Migraine 05/28/2015  . OSA on CPAP 09/27/2014  . Insomnia 03/06/2014  . Pure hypercholesterolemia 01/20/2013  . Other abnormal glucose 01/20/2013  . Generalized anxiety disorder 01/20/2013    . DDD (degenerative disc disease), lumbar 01/20/2013   Past Medical History:  Diagnosis Date  . Anxiety state, unspecified   . Arthritis   . Basal cell carcinoma of skin, site unspecified    L nasal; [facility].  . Cervical spinal stenosis   . Depression   . Encounter for therapeutic drug monitoring   . Genital herpes   . Genital herpes, unspecified   . HNP (herniated nucleus pulposus), cervical   . Insomnia, unspecified   . Internal hemorrhoids    colonoscopy 08/04/2008.  [facility].  . Internal hemorrhoids without mention of complication   . Lumbago   . 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 OSA on CPAP 09/27/2014  . Other specified inflammatory disease of prostate    outlet obstruction with BPH.Urology consult 2006 [facility]  . Other specified viral warts   . Pain in joint, shoulder region   . Pre-diabetes   . Pure hypercholesterolemia   . Sleep apnea   . Sleep apnea   . Sleep related headaches 06/22/2014  . Unspecified hearing loss   . Unspecified vitamin D deficiency     Family History  Problem Relation Age of Onset  . COPD Mother   . Diabetes Mother   . Heart disease Mother 72       cardiac stenting/CAD; no AMI  . Cancer Maternal Grandmother   . Diabetes Maternal Grandmother   . Tics Father   .  Dementia Father   . Mental illness Father   . Lung disease Unknown        Oxygen dependent    Past Surgical History:  Procedure Laterality Date  . ANTERIOR CERVICAL DECOMP/DISCECTOMY FUSION N/A 10/09/2017   Procedure: C6-7 ANTERIOR CERVICAL DISCECTOMY & FUSION, ALLOGRAFT, PLATE;  Surgeon: Marybelle Killings, MD;  Location: [facility];  Service: Orthopedics;  Laterality: N/A;  . COLONOSCOPY  08/04/2008   internal hemorrhoids; repeat in 10 years.  [facility].  Marland Kitchen KNEE SURGERY  1991  . L. facial basal cell carcinoma resection  11/2010  . PROSTATE SURGERY  2010   for BPH  . VASECTOMY     Social History   Occupational History  . Occupation: Sales  Tobacco Use  . Smoking status:  Never Smoker  . Smokeless tobacco: Never Used  Substance and Sexual Activity  . Alcohol use: Yes    Alcohol/week: 9.0 oz    Types: 15 Glasses of wine per week    Comment: moderate 1-2 glasses of wine per day; more on weekends.  . Drug use: No    Comment: prevoiusly used marijuana  . Sexual activity: Yes    Birth control/protection: Post-menopausal

## 2017-12-22 ENCOUNTER — Other Ambulatory Visit: Payer: Self-pay | Admitting: Family Medicine

## 2017-12-22 NOTE — Telephone Encounter (Signed)
Pt advised.

## 2017-12-25 ENCOUNTER — Other Ambulatory Visit: Payer: Self-pay

## 2017-12-25 ENCOUNTER — Ambulatory Visit (INDEPENDENT_AMBULATORY_CARE_PROVIDER_SITE_OTHER): Payer: BLUE CROSS/BLUE SHIELD | Admitting: Family Medicine

## 2017-12-25 ENCOUNTER — Encounter: Payer: Self-pay | Admitting: Family Medicine

## 2017-12-25 VITALS — BP 122/82 | HR 111 | Temp 98.0°F | Resp 16 | Ht 69.09 in | Wt 203.0 lb

## 2017-12-25 DIAGNOSIS — F5105 Insomnia due to other mental disorder: Secondary | ICD-10-CM | POA: Diagnosis not present

## 2017-12-25 DIAGNOSIS — F411 Generalized anxiety disorder: Secondary | ICD-10-CM | POA: Diagnosis not present

## 2017-12-25 DIAGNOSIS — Z9989 Dependence on other enabling machines and devices: Secondary | ICD-10-CM

## 2017-12-25 DIAGNOSIS — F99 Mental disorder, not otherwise specified: Secondary | ICD-10-CM

## 2017-12-25 DIAGNOSIS — G4733 Obstructive sleep apnea (adult) (pediatric): Secondary | ICD-10-CM

## 2017-12-25 MED ORDER — ESCITALOPRAM OXALATE 10 MG PO TABS: 10.0000 mg | ORAL_TABLET | Freq: Every day | ORAL | 0 refills | Status: DC

## 2017-12-25 MED ORDER — ALPRAZOLAM 1 MG PO TABS: 1.0000 mg | ORAL_TABLET | Freq: Every evening | ORAL | 5 refills | Status: AC | PRN

## 2017-12-25 NOTE — Progress Notes (Signed)
Subjective:    Patient ID: John Preston, male    DOB: 1955-10-18, 62 y.o.   MRN: [id number]  12/25/2017  Chronic Conditions (6 month follow-up,  pt states his depression  has been worst and would like to possibly change medication )    HPI This 62 y.o. male presents for three month follow-up for worsening depression. Previously took Lexapro.  Switched to Cymbalta with worsening migraines. Now that has retired, rarely having headaches. Six or eight months ago, increased Cymbalta to 120mg  daily.  Suffered with ED with higher doses of Cymbalta.  Wife is much better; suffered with side effects to Prednisone.  Mostly healed up.   Feeling down lately; requested increase in Cymbalta to two daily; depression worsened.  Duration for one month.  Down, depressed; excessive sleeping.  Staying in bed all day.  No motivation.   Stopped taking two Cymbalta a few days ago.   Completely out of Xanax; denied refill.  Needing Xanax every night.   Will skip some nights; then some nights will need two tablets.  Would like to forget Cymbalta and switched back to Lexapro. No problems with ED on Lexapro.   Switched CPE July 2019.  Talking to neighbor.   Underwent cervical spine surgery. Son going through a divorce. Daughter is bipolar and cannot keep a job.  Now daughter is living with brother.   Excessive sadness; tearful. A lot of stress.   No SI.   Daughter just started a new job this week.  Walking dog every day.  Dog zig zags.   Resistant to counseling at this time.    BP Readings from Last 3 Encounters:  02/15/18 118/72  12/25/17 122/82  12/16/17 119/85   Wt Readings from Last 3 Encounters:  02/15/18 214 lb (97.1 kg)  12/25/17 203 lb (92.1 kg)  10/09/17 203 lb (92.1 kg)   Immunization History  Administered Date(s) Administered  . Influenza Split 06/28/2012  . Influenza,inj,Quad PF,6+ Mos 09/05/2013, 04/27/2014, 04/25/2015, 04/16/2016, 08/24/2017  . Influenza-Unspecified  06/13/2010, 06/16/2011  . Tdap 06/16/2011    Review of Systems  Constitutional: Positive for fatigue. Negative for activity change, appetite change, chills, diaphoresis, fever and unexpected weight change.  HENT: Negative for congestion, dental problem, drooling, ear discharge, ear pain, facial swelling, hearing loss, mouth sores, nosebleeds, postnasal drip, rhinorrhea, sinus pressure, sneezing, sore throat, tinnitus, trouble swallowing and voice change.   Eyes: Negative for photophobia, pain, discharge, redness, itching and visual disturbance.  Respiratory: Negative for apnea, cough, choking, chest tightness, shortness of breath, wheezing and stridor.   Cardiovascular: Negative for chest pain, palpitations and leg swelling.  Gastrointestinal: Negative for abdominal pain, blood in stool, constipation, diarrhea, nausea and vomiting.  Endocrine: Negative for cold intolerance, heat intolerance, polydipsia, polyphagia and polyuria.  Genitourinary: Negative for decreased urine volume, difficulty urinating, discharge, dysuria, enuresis, flank pain, frequency, genital sores, hematuria, penile pain, penile swelling, scrotal swelling, testicular pain and urgency.  Musculoskeletal: Negative for arthralgias, back pain, gait problem, joint swelling, myalgias, neck pain and neck stiffness.  Skin: Negative for color change, pallor, rash and wound.  Allergic/Immunologic: Negative for environmental allergies, food allergies and immunocompromised state.  Neurological: Negative for dizziness, tremors, seizures, syncope, facial asymmetry, speech difficulty, weakness, light-headedness, numbness and headaches.  Hematological: Negative for adenopathy. Does not bruise/bleed easily.  Psychiatric/Behavioral: Positive for dysphoric mood and sleep disturbance. Negative for agitation, behavioral problems, confusion, decreased concentration, hallucinations, self-injury and suicidal ideas. The patient is nervous/anxious. The  patient is not hyperactive.  Past Medical History:  Diagnosis Date  . Anxiety state, unspecified   . Arthritis   . Basal cell carcinoma of skin, site unspecified    L nasal; [facility].  . Cervical spinal stenosis   . Depression   . Encounter for therapeutic drug monitoring   . Genital herpes   . Genital herpes, unspecified   . HNP (herniated nucleus pulposus), cervical   . Insomnia, unspecified   . Internal hemorrhoids    colonoscopy 08/04/2008.  [facility].  . Internal hemorrhoids without mention of complication   . Lumbago   . 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 OSA on CPAP 09/27/2014  . Other specified inflammatory disease of prostate    outlet obstruction with BPH.Urology consult 2006 [facility]  . Other specified viral warts   . Pain in joint, shoulder region   . Pre-diabetes   . Pure hypercholesterolemia   . Sleep apnea   . Sleep apnea   . Sleep related headaches 06/22/2014  . Unspecified hearing loss   . Unspecified vitamin D deficiency    Past Surgical History:  Procedure Laterality Date  . ANTERIOR CERVICAL DECOMP/DISCECTOMY FUSION N/A 10/09/2017   Procedure: C6-7 ANTERIOR CERVICAL DISCECTOMY & FUSION, ALLOGRAFT, PLATE;  Surgeon: Marybelle Killings, MD;  Location: [facility];  Service: Orthopedics;  Laterality: N/A;  . COLONOSCOPY  08/04/2008   internal hemorrhoids; repeat in 10 years.  [facility].  Marland Kitchen KNEE SURGERY  1991  . L. facial basal cell carcinoma resection  11/2010  . PROSTATE SURGERY  2010   for BPH  . VASECTOMY     No Known Allergies Current Outpatient Medications on File Prior to Visit  Medication Sig Dispense Refill  . atorvastatin (LIPITOR) 40 MG tablet Take 1 tablet (40 mg total) by mouth daily. 90 tablet 1  . indomethacin (INDOCIN) 25 MG capsule TAKE 2 CAPSULES (50 MG TOTAL) BY MOUTH 2 (TWO) TIMES DAILY WITH A MEAL.  1   No current facility-administered medications on file prior to visit.    Social History   Socioeconomic History  . Marital status:  Married    Spouse name: Kieth Brightly  . Number of children: 2  . Years of education: Not on file  . Highest education level: Not on file  Occupational History  . Occupation: Press photographer  Social Needs  . Financial resource strain: Not on file  . Food insecurity:    Worry: Not on file    Inability: Not on file  . Transportation needs:    Medical: Not on file    Non-medical: Not on file  Tobacco Use  . Smoking status: Never Smoker  . Smokeless tobacco: Never Used  Substance and Sexual Activity  . Alcohol use: Yes    Alcohol/week: 9.0 oz    Types: 15 Glasses of wine per week    Comment: moderate 1-2 glasses of wine per day; more on weekends.  . Drug use: No    Comment: prevoiusly used marijuana  . Sexual activity: Yes    Birth control/protection: Post-menopausal  Lifestyle  . Physical activity:    Days per week: Not on file    Minutes per session: Not on file  . Stress: Not on file  Relationships  . Social connections:    Talks on phone: Not on file    Gets together: Not on file    Attends religious service: Not on file    Active member of club or organization: Not on file    Attends meetings of clubs or organizations:  Not on file    Relationship status: Not on file  . Intimate partner violence:    Fear of current or ex partner: Not on file    Emotionally abused: Not on file    Physically abused: Not on file    Forced sexual activity: Not on file  Other Topics Concern  . Not on file  Social History Narrative   Marital status:  Married x 33 years, happily.      Children: two adult children (Chelsea and Theresia Lo); no grandchildren.      Lives: with wife.      Employment:  Retired in 2018.      Tobacco:  None       Alcohol:   2-3 glasses of beer per night.  Usually 3 on weekends.      Drugs: none      Exercise:none in 2018.  NO yard work due to lower back pain.      Seatbelts:  Always uses seat belts.  No texting while driving.       Home safety:  Smoke alarm in the home. Carbon  monoxide detector in the home.       Guns:  Guns in the home not stored in locked cabinet.       Caffeine use: 2 servings Coffee per day.   Family History  Problem Relation Age of Onset  . COPD Mother   . Diabetes Mother   . Heart disease Mother 71       cardiac stenting/CAD; no AMI  . Cancer Maternal Grandmother   . Diabetes Maternal Grandmother   . Tics Father   . Dementia Father   . Mental illness Father   . Lung disease Unknown        Oxygen dependent       Objective:    BP 122/82   Pulse (!) 111   Temp 98 F (36.7 C) (Oral)   Resp 16   Ht 5' 9.09" (1.755 m)   Wt 203 lb (92.1 kg)   SpO2 97%   BMI 29.90 kg/m  Physical Exam  Constitutional: He is oriented to person, place, and time. He appears well-developed and well-nourished. No distress.  HENT:  Head: Normocephalic and atraumatic.  Right Ear: External ear normal.  Left Ear: External ear normal.  Nose: Nose normal.  Mouth/Throat: Oropharynx is clear and moist.  Eyes: Pupils are equal, round, and reactive to light. Conjunctivae and EOM are normal.  Neck: Normal range of motion. Neck supple. Carotid bruit is not present. No thyromegaly present.  Cardiovascular: Normal rate, regular rhythm, normal heart sounds and intact distal pulses. Exam reveals no gallop and no friction rub.  No murmur heard. Pulmonary/Chest: Effort normal and breath sounds normal. He has no wheezes. He has no rales.  Abdominal: Soft. Bowel sounds are normal. He exhibits no distension and no mass. There is no tenderness. There is no rebound and no guarding.  Lymphadenopathy:    He has no cervical adenopathy.  Neurological: He is alert and oriented to person, place, and time. No cranial nerve deficit.  Skin: Skin is warm and dry. No rash noted. He is not diaphoretic.  Psychiatric: He has a normal mood and affect. His behavior is normal.  Nursing note and vitals reviewed.  No results found. Depression screen [facility] 2/9 02/15/2018 12/25/2017  09/16/2017 08/24/2017 02/13/2017  Decreased Interest 0 1 0 0 0  Down, Depressed, Hopeless 0 1 0 0 0  PHQ - 2 Score 0 2  0 0 0  Altered sleeping - 0 - - -  Tired, decreased energy - 0 - - -  Change in appetite - 0 - - -  Feeling bad or failure about yourself  - 0 - - -  PHQ-9 Score - 2 - - -   Fall Risk  02/15/2018 12/25/2017 09/16/2017 08/24/2017 02/13/2017  Falls in the past year? No No No No No        Assessment & Plan:   1. Generalized anxiety disorder   2. Insomnia due to other mental disorder   3. OSA on CPAP     Uncontrolled anxiety with depression; continue cymbalta 60mg  one daily for one month and then decrease to every other day for two weeks and then stop.  Start Lexapro 10mg  daily for one month then increase to two tablets daily.  Start exercising every day for stress management.  Highly recommend psychotherapy.  Refill of Xanax provided and patient is aware of increased risk of dementia with chronic benzo use and prefers to continue medication.    No orders of the defined types were placed in this encounter.  Meds ordered this encounter  Medications  . DISCONTD: escitalopram (LEXAPRO) 10 MG tablet    Sig: Take 1 tablet (10 mg total) by mouth daily.    Dispense:  90 tablet    Refill:  0  . ALPRAZolam (XANAX) 1 MG tablet    Sig: Take 1 tablet (1 mg total) by mouth at bedtime as needed for anxiety.    Dispense:  30 tablet    Refill:  5    Not to exceed 3 additional fills before 08/12/2017.    No follow-ups on file.   Stamatia Masri Elayne Guerin, M.D. Primary Care at [facility] [address] [place], [place]  [id number] [phone] phone [phone] fax

## 2017-12-25 NOTE — Patient Instructions (Addendum)
CONTINUE CYMBALTA 60MG  ONE DAILY FOR ONE MONTH, THEN DECREASE CYMBALTA TO EVERY OTHER DAY FOR TWO WEEKS AND THEN STOP. START LEXAPRO 10MG  ONE DAILY FOR ONE MONTH THEN INCREASE TO TWO TABLETS DAILY.   START EXERCISING EVERY DAY FOR STRESS MANAGEMENT. I HIGHLY RECOMMEND COUNSELING.   IF you received an x-ray today, you will receive an invoice from [facility]. Please contact [facility] at [phone] with questions or concerns regarding your invoice.   IF you received labwork today, you will receive an invoice from [facility]. Please contact [facility] at [phone] with questions or concerns regarding your invoice.   Our billing staff will not be able to assist you with questions regarding bills from these companies.  You will be contacted with the lab results as soon as they are available. The fastest way to get your results is to activate your My Chart account. Instructions are located on the last page of this paperwork. If you have not heard from [place] regarding the results in 2 weeks, please contact this office.     Major Depressive Disorder, Adult Major depressive disorder (MDD) is a mental health condition. MDD often makes you feel sad, hopeless, or helpless. MDD can also cause symptoms in your body. MDD can affect your:  Work.  School.  Relationships.  Other normal activities.  MDD can range from mild to very bad. It may occur once (single episode MDD). It can also occur many times (recurrent MDD). The main symptoms of MDD often include:  Feeling sad, depressed, or irritable most of the time.  Loss of interest.  MDD symptoms also include:  Sleeping too much or too little.  Eating too much or too little.  A change in your weight.  Feeling tired (fatigue) or having low energy.  Feeling worthless.  Feeling guilty.  Trouble making decisions.  Trouble thinking clearly.  Thoughts of suicide or harming others.  Feeling weak.  Feeling  agitated.  Keeping yourself from being around other people (isolation).  Follow these instructions at home: Activity  Do these things as told by your doctor: ? Go back to your normal activities. ? Exercise regularly. ? Spend time outdoors. Alcohol  Talk with your doctor about how alcohol can affect your antidepressant medicines.  Do not drink alcohol. Or, limit how much alcohol you drink. ? This means no more than 1 drink a day for nonpregnant women and 2 drinks a day for men. One drink equals one of these:  12 oz of beer.  5 oz of wine.  1 oz of hard liquor. General instructions  Take over-the-counter and prescription medicines only as told by your doctor.  Eat a healthy diet.  Get plenty of sleep.  Find activities that you enjoy. Make time to do them.  Think about joining a support group. Your doctor may be able to suggest a group for you.  Keep all follow-up visits as told by your doctor. This is important. Where to find more information:  [employer] on Mental Illness: ? www.nami.[place]: ? https://carter.com/  National Suicide Prevention Lifeline: ? (629)662-1597. This is free, 24-hour help. Contact a doctor if:  Your symptoms get worse.  You have new symptoms. Get help right away if:  You self-harm.  You see, hear, taste, smell, or feel things that are not present (hallucinate). If you ever feel like you may hurt yourself or others, or have thoughts about taking your own life, get help right away. You can  go to your nearest emergency department or call:  Your local emergency services (911 in the [place]  A suicide crisis helpline, such as the National Suicide Prevention Lifeline: ? 561-459-5704. This is open 24 hours a day.  This information is not intended to replace advice given to you by your health care provider. Make sure you discuss any questions you have with your health care provider. Document  Released: 07/02/2015 Document Revised: 04/06/2016 Document Reviewed: 04/06/2016 Elsevier Interactive Patient Education  2017 [employer].

## 2017-12-28 ENCOUNTER — Encounter: Payer: Self-pay | Admitting: Family Medicine

## 2018-02-15 ENCOUNTER — Other Ambulatory Visit: Payer: Self-pay

## 2018-02-15 ENCOUNTER — Encounter: Payer: Self-pay | Admitting: Family Medicine

## 2018-02-15 ENCOUNTER — Ambulatory Visit (INDEPENDENT_AMBULATORY_CARE_PROVIDER_SITE_OTHER): Payer: BLUE CROSS/BLUE SHIELD | Admitting: Family Medicine

## 2018-02-15 VITALS — BP 118/72 | HR 88 | Temp 98.0°F | Resp 16 | Ht 69.49 in | Wt 214.0 lb

## 2018-02-15 DIAGNOSIS — G43709 Chronic migraine without aura, not intractable, without status migrainosus: Secondary | ICD-10-CM

## 2018-02-15 DIAGNOSIS — E7800 Pure hypercholesterolemia, unspecified: Secondary | ICD-10-CM | POA: Diagnosis not present

## 2018-02-15 DIAGNOSIS — G4733 Obstructive sleep apnea (adult) (pediatric): Secondary | ICD-10-CM | POA: Diagnosis not present

## 2018-02-15 DIAGNOSIS — R7309 Other abnormal glucose: Secondary | ICD-10-CM | POA: Diagnosis not present

## 2018-02-15 DIAGNOSIS — Z9989 Dependence on other enabling machines and devices: Secondary | ICD-10-CM

## 2018-02-15 DIAGNOSIS — A6001 Herpesviral infection of penis: Secondary | ICD-10-CM

## 2018-02-15 DIAGNOSIS — F411 Generalized anxiety disorder: Secondary | ICD-10-CM | POA: Diagnosis not present

## 2018-02-15 LAB — CBC WITH DIFFERENTIAL/PLATELET
Basophils Absolute: 0 10*3/uL (ref 0.0–0.2)
Basos: 1 %
EOS (ABSOLUTE): 0.2 10*3/uL (ref 0.0–0.4)
Eos: 4 %
HEMATOCRIT: 43.7 % (ref 37.5–51.0)
HEMOGLOBIN: 14.9 g/dL (ref 13.0–17.7)
Immature Grans (Abs): 0 10*3/uL (ref 0.0–0.1)
Immature Granulocytes: 0 %
LYMPHS ABS: 1.6 10*3/uL (ref 0.7–3.1)
LYMPHS: 33 %
MCH: 31.6 pg (ref 26.6–33.0)
MCHC: 34.1 g/dL (ref 31.5–35.7)
MCV: 93 fL (ref 79–97)
Monocytes Absolute: 0.4 10*3/uL (ref 0.1–0.9)
Monocytes: 9 %
NEUTROS ABS: 2.5 10*3/uL (ref 1.4–7.0)
NEUTROS PCT: 53 %
Platelets: 230 10*3/uL (ref 150–450)
RBC: 4.72 x10E6/uL (ref 4.14–5.80)
RDW: 13.2 % (ref 12.3–15.4)
WBC: 4.8 10*3/uL (ref 3.4–10.8)

## 2018-02-15 LAB — COMPREHENSIVE METABOLIC PANEL
ALBUMIN: 4.3 g/dL (ref 3.5–5.5)
ALK PHOS: 65 IU/L (ref 39–117)
ALT: 58 IU/L — ABNORMAL HIGH (ref 0–44)
AST: 33 IU/L (ref 0–40)
Albumin/Globulin Ratio: 2 (ref 1.2–2.2)
BILIRUBIN TOTAL: 0.3 mg/dL (ref 0.0–1.2)
BUN / CREAT RATIO: 18 (ref 9–20)
BUN: 17 mg/dL (ref 6–24)
CHLORIDE: 100 mmol/L (ref 96–106)
CO2: 24 mmol/L (ref 20–29)
CREATININE: 0.95 mg/dL (ref 0.76–1.27)
Calcium: 9.2 mg/dL (ref 8.7–10.2)
GFR calc non Af Amer: 87 mL/min/{1.73_m2} (ref >59)
GFR, EST AFRICAN AMERICAN: 101 mL/min/{1.73_m2} (ref >59)
GLOBULIN, TOTAL: 2.1 g/dL (ref 1.5–4.5)
Glucose: 114 mg/dL — ABNORMAL HIGH (ref 65–99)
Potassium: 4.5 mmol/L (ref 3.5–5.2)
SODIUM: 139 mmol/L (ref 134–144)
TOTAL PROTEIN: 6.4 g/dL (ref 6.0–8.5)

## 2018-02-15 LAB — HEMOGLOBIN A1C
ESTIMATED AVERAGE GLUCOSE: 120 mg/dL
Hgb A1c MFr Bld: 5.8 % — ABNORMAL HIGH (ref 4.8–5.6)

## 2018-02-15 LAB — LIPID PANEL
CHOLESTEROL TOTAL: 195 mg/dL (ref 100–199)
Chol/HDL Ratio: 5.3 ratio — ABNORMAL HIGH (ref 0.0–5.0)
HDL: 37 mg/dL — ABNORMAL LOW (ref >39)
Triglycerides: 447 mg/dL — ABNORMAL HIGH (ref 0–149)

## 2018-02-15 MED ORDER — ESCITALOPRAM OXALATE 20 MG PO TABS: 20.0000 mg | ORAL_TABLET | Freq: Every day | ORAL | 1 refills | Status: AC

## 2018-02-15 MED ORDER — ACYCLOVIR 800 MG PO TABS: 800.0000 mg | ORAL_TABLET | Freq: Three times a day (TID) | ORAL | 4 refills | Status: AC | PRN

## 2018-02-15 NOTE — Patient Instructions (Signed)
     IF you received an x-ray today, you will receive an invoice from [facility]. Please contact [facility] at [phone] with questions or concerns regarding your invoice.   IF you received labwork today, you will receive an invoice from [facility]. Please contact [facility] at [phone] with questions or concerns regarding your invoice.   Our billing staff will not be able to assist you with questions regarding bills from these companies.  You will be contacted with the lab results as soon as they are available. The fastest way to get your results is to activate your My Chart account. Instructions are located on the last page of this paperwork. If you have not heard from us regarding the results in 2 weeks, please contact this office.     

## 2018-02-15 NOTE — Progress Notes (Signed)
Subjective:    Patient ID: John Preston, male    DOB: 07/25/1956, 61 y.o.   MRN: [id number]  02/15/2018  Chronic Conditions (6 month follow-up )    HPI This 62 y.o. male presents for six month follow-up of hypercholesterolemia, glucose intolerance, migraines, anxiety/depression.  Management changes made at last visit:   CONTINUE CYMBALTA 60MG  ONE DAILY FOR ONE MONTH, THEN DECREASE CYMBALTA TO EVERY OTHER DAY FOR TWO WEEKS AND THEN STOP. START LEXAPRO 10MG  ONE DAILY FOR ONE MONTH THEN INCREASE TO TWO TABLETS DAILY.   START EXERCISING EVERY DAY FOR STRESS MANAGEMENT. I HIGHLY RECOMMEND COUNSELING.  Moderately controlled hypercholesterolemia.  Obtain labs.  Refill of atorvastatin 20 mg daily provided.  Glucose intolerance: Congratulations on weight loss and exercise since last visit.  Repeat labs today.   I recommend weight loss, exercise, and low-carbohydrate low-sugar food choices. You should AVOID: regular sodas, sweetened tea, fruit juices.  You should LIMIT: breads, pastas, rice, potatoes, and desserts/sweets.  I would recommend limiting your total carbohydrate intake per meal to 45 grams; I would limit your total carbohydrate intake per snack to 30 grams.  I would also have a goal of 60 grams of protein intake per day; this would equal 10-15 grams of protein per meal and 5-10 grams of protein per snack.  Elevated liver function tests: Likely secondary to excessive alcohol intake as well as overweight status.  Congratulations on weight loss.  Repeat liver function test today with hepatitis panel.  Recommend decreasing alcohol intake as well as avoiding Tylenol-containing products.  Will warrant abdominal ultrasound if levels remain elevated.  Migraines much improved Since retirement.  Abnormal EKG: Status post cardiology consultation with stress testing that was negative.  Clearance to proceed with Viagra usage for erectile dysfunction.  Currently.  No changes to therapy.   Patient continues to suffer with chronic insomnia which is due to anxiety as well as excessive alcohol intake.  Had a very long discussion with patient again regarding long-term Xanax use.  We discussed the risk of dementia with long-term benzodiazepine use.  Patient willing to take risk of development of dementia and request ongoing Xanax usage.  I encouraged decreased alcohol intake to avoid disruption of REM sleep.    No evidence of anemia. Sugar/glucose is elevated at 121, yet keep up the great work with exercise and weight loss. Continue to limit sugar and carbohydrate intake. Hemoglobin A1c has improved from 6.2-5.6. Kidney function is normal. Liver function studies remain elevated. I recommend decreasing alcohol intake. There is no evidence of hepatitis A, B, or C as cause of elevated liver function test. I also recommend undergoing an abdominal ultrasound to visualize the liver. I will schedule. Cholesterol remains elevated. Urine is normal.  UPDATE: Has applied for [facility] benefits and received benefits.   Has a CPE scheduled with the [facility] next month on 03/23/18. Increased Lexapro to two daily.  Still wanting to sleep excessively; not wanting to get out of bed. [facility] treating currently with Augmentin.  Went to the beach at end of June; horrible nasal congestion.  L sinus congestion/pain.  No recent migraine.  No recent use of Maxalt. Has plenty of Indomethacin. No formal exercise; walking the dog yet slow and short. Excessive worry about children; watches the news which is very upsetting.   BP Readings from Last 3 Encounters:  02/15/18 118/72  12/25/17 122/82  12/16/17 119/85   Wt Readings from Last 3 Encounters:  02/15/18 214 lb (97.1 kg)  12/25/17 203 lb (92.1 kg)  10/09/17 203 lb (92.1 kg)   Immunization History  Administered Date(s) Administered  . Influenza Split 06/28/2012  . Influenza,inj,Quad PF,6+ Mos 09/05/2013, 04/27/2014, 04/25/2015, 04/16/2016,  08/24/2017  . Influenza-Unspecified 06/13/2010, 06/16/2011  . Tdap 06/16/2011    Review of Systems  Constitutional: Positive for fatigue. Negative for activity change, appetite change, chills, diaphoresis, fever and unexpected weight change.  HENT: Negative for congestion, dental problem, drooling, ear discharge, ear pain, facial swelling, hearing loss, mouth sores, nosebleeds, postnasal drip, rhinorrhea, sinus pressure, sneezing, sore throat, tinnitus, trouble swallowing and voice change.   Eyes: Negative for photophobia, pain, discharge, redness, itching and visual disturbance.  Respiratory: Negative for apnea, cough, choking, chest tightness, shortness of breath, wheezing and stridor.   Cardiovascular: Negative for chest pain, palpitations and leg swelling.  Gastrointestinal: Negative for abdominal pain, blood in stool, constipation, diarrhea, nausea and vomiting.  Endocrine: Negative for cold intolerance, heat intolerance, polydipsia, polyphagia and polyuria.  Genitourinary: Negative for decreased urine volume, difficulty urinating, discharge, dysuria, enuresis, flank pain, frequency, genital sores, hematuria, penile pain, penile swelling, scrotal swelling, testicular pain and urgency.  Musculoskeletal: Negative for arthralgias, back pain, gait problem, joint swelling, myalgias, neck pain and neck stiffness.  Skin: Negative for color change, pallor, rash and wound.  Allergic/Immunologic: Negative for environmental allergies, food allergies and immunocompromised state.  Neurological: Negative for dizziness, tremors, seizures, syncope, facial asymmetry, speech difficulty, weakness, light-headedness, numbness and headaches.  Hematological: Negative for adenopathy. Does not bruise/bleed easily.  Psychiatric/Behavioral: Negative for agitation, behavioral problems, confusion, decreased concentration, dysphoric mood, hallucinations, self-injury, sleep disturbance and suicidal ideas. The patient is  nervous/anxious. The patient is not hyperactive.     Past Medical History:  Diagnosis Date  . Anxiety state, unspecified   . Arthritis   . Basal cell carcinoma of skin, site unspecified    L nasal; [place].  . Cervical spinal stenosis   . Depression   . Encounter for therapeutic drug monitoring   . Genital herpes   . Genital herpes, unspecified   . HNP (herniated nucleus pulposus), cervical   . Insomnia, unspecified   . Internal hemorrhoids    colonoscopy 08/04/2008.  [facility].  . Internal hemorrhoids without mention of complication   . Lumbago   . 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 OSA on CPAP 09/27/2014  . Other specified inflammatory disease of prostate    outlet obstruction with BPH.Urology consult 2006 [facility]  . Other specified viral warts   . Pain in joint, shoulder region   . Pre-diabetes   . Pure hypercholesterolemia   . Sleep apnea   . Sleep apnea   . Sleep related headaches 06/22/2014  . Unspecified hearing loss   . Unspecified vitamin D deficiency    Past Surgical History:  Procedure Laterality Date  . ANTERIOR CERVICAL DECOMP/DISCECTOMY FUSION N/A 10/09/2017   Procedure: C6-7 ANTERIOR CERVICAL DISCECTOMY & FUSION, ALLOGRAFT, PLATE;  Surgeon: Marybelle Killings, MD;  Location: [facility];  Service: Orthopedics;  Laterality: N/A;  . COLONOSCOPY  08/04/2008   internal hemorrhoids; repeat in 10 years.  [facility].  [facility] KNEE SURGERY  1991  . L. facial basal cell carcinoma resection  11/2010  . PROSTATE SURGERY  2010   for BPH  . VASECTOMY     No Known Allergies Current Outpatient Medications on File Prior to Visit  Medication Sig Dispense Refill  . ALPRAZolam (XANAX) 1 MG tablet Take 1 tablet (1 mg total) by mouth at bedtime as needed for anxiety.  30 tablet 5  . atorvastatin (LIPITOR) 40 MG tablet Take 1 tablet (40 mg total) by mouth daily. 90 tablet 1  . indomethacin (INDOCIN) 25 MG capsule TAKE 2 CAPSULES (50 MG TOTAL) BY MOUTH 2 (TWO) TIMES DAILY WITH A MEAL.  1    No current facility-administered medications on file prior to visit.    Social History   Socioeconomic History  . Marital status: Married    Spouse name: Kieth Brightly  . Number of children: 2  . Years of education: Not on file  . Highest education level: Not on file  Occupational History  . Occupation: Press photographer  Social Needs  . Financial resource strain: Not on file  . Food insecurity:    Worry: Not on file    Inability: Not on file  . Transportation needs:    Medical: Not on file    Non-medical: Not on file  Tobacco Use  . Smoking status: Never Smoker  . Smokeless tobacco: Never Used  Substance and Sexual Activity  . Alcohol use: Yes    Alcohol/week: 9.0 oz    Types: 15 Glasses of wine per week    Comment: moderate 1-2 glasses of wine per day; more on weekends.  . Drug use: No    Comment: prevoiusly used marijuana  . Sexual activity: Yes    Birth control/protection: Post-menopausal  Lifestyle  . Physical activity:    Days per week: Not on file    Minutes per session: Not on file  . Stress: Not on file  Relationships  . Social connections:    Talks on phone: Not on file    Gets together: Not on file    Attends religious service: Not on file    Active member of club or organization: Not on file    Attends meetings of clubs or organizations: Not on file    Relationship status: Not on file  . Intimate partner violence:    Fear of current or ex partner: Not on file    Emotionally abused: Not on file    Physically abused: Not on file    Forced sexual activity: Not on file  Other Topics Concern  . Not on file  Social History Narrative   Marital status:  Married x 33 years, happily.      Children: two adult children (Chelsea and Theresia Lo); no grandchildren.      Lives: with wife.      Employment:  Retired in 2018.      Tobacco:  None       Alcohol:   2-3 glasses of beer per night.  Usually 3 on weekends.      Drugs: none      Exercise:none in 2018.  NO yard work due to  lower back pain.      Seatbelts:  Always uses seat belts.  No texting while driving.       Home safety:  Smoke alarm in the home. Carbon monoxide detector in the home.       Guns:  Guns in the home not stored in locked cabinet.       Caffeine use: 2 servings Coffee per day.   Family History  Problem Relation Age of Onset  . COPD Mother   . Diabetes Mother   . Heart disease Mother 72       cardiac stenting/CAD; no AMI  . Cancer Maternal Grandmother   . Diabetes Maternal Grandmother   . Tics Father   . Dementia Father   .  Mental illness Father   . Lung disease Unknown        Oxygen dependent       Objective:    BP 118/72   Pulse 88   Temp 98 F (36.7 C) (Oral)   Resp 16   Ht 5' 9.49" (1.765 m)   Wt 214 lb (97.1 kg)   SpO2 98%   BMI 31.16 kg/m  Physical Exam  Constitutional: He is oriented to person, place, and time. He appears well-developed and well-nourished. No distress.  HENT:  Head: Normocephalic and atraumatic.  Right Ear: External ear normal.  Left Ear: External ear normal.  Nose: Nose normal.  Mouth/Throat: Oropharynx is clear and moist.  Eyes: Pupils are equal, round, and reactive to light. Conjunctivae and EOM are normal.  Neck: Normal range of motion. Neck supple. Carotid bruit is not present. No thyromegaly present.  Cardiovascular: Normal rate, regular rhythm, normal heart sounds and intact distal pulses. Exam reveals no gallop and no friction rub.  No murmur heard. Pulmonary/Chest: Effort normal and breath sounds normal. He has no wheezes. He has no rales.  Abdominal: Soft. Bowel sounds are normal. He exhibits no distension and no mass. There is no tenderness. There is no rebound and no guarding.  Lymphadenopathy:    He has no cervical adenopathy.  Neurological: He is alert and oriented to person, place, and time. No cranial nerve deficit.  Skin: Skin is warm and dry. No rash noted. He is not diaphoretic.  Psychiatric: He has a normal mood and affect.  His behavior is normal.  Nursing note and vitals reviewed.  No results found. Depression screen [facility] 2/9 02/15/2018 12/25/2017 09/16/2017 08/24/2017 02/13/2017  Decreased Interest 0 1 0 0 0  Down, Depressed, Hopeless 0 1 0 0 0  PHQ - 2 Score 0 2 0 0 0  Altered sleeping - 0 - - -  Tired, decreased energy - 0 - - -  Change in appetite - 0 - - -  Feeling bad or failure about yourself  - 0 - - -  PHQ-9 Score - 2 - - -   Fall Risk  02/15/2018 12/25/2017 09/16/2017 08/24/2017 02/13/2017  Falls in the past year? No No No No No        Assessment & Plan:   1. Pure hypercholesterolemia   2. Other abnormal glucose   3. Generalized anxiety disorder   4. OSA on CPAP   5. Chronic migraine without aura without status migrainosus, not intractable   6. Herpes simplex infection of penis     Hypercholesterolemia: Uncontrolled with increase in statin therapy at last visit.  Repeat labs today.  Continue current medication. Glucose intolerance: Stable.  Obtain labs for chronic disease management.  Highly recommend weight loss, exercise, low sugar low carbohydrate food choices. Generalized anxiety disorder: Moderately controlled on Lexapro 20 mg daily.  Refill provided.  Continues to suffer with ongoing excessive worry regarding children.  Also lacking motivation and hypersomnolence.  Encouraged increased exercise and recommend discussing psychotherapy with [facility]. Obstructive sleep apnea on CPAP: Controlled at this time.  Patient reports good compliance with CPAP therapy especially with recent weight gain. Migraines: Well-controlled at this time. Obesity: Highly recommend weight loss, exercise, low calorie food choices.  Orders Placed This Encounter  Procedures  . CBC with Differential/Platelet  . Comprehensive metabolic panel    Order Specific Question:   Has the patient fasted?    Answer:   No  . Lipid panel  Order Specific Question:   Has the patient fasted?    Answer:   No  . Hemoglobin A1c    Meds ordered this encounter  Medications  . acyclovir (ZOVIRAX) 800 MG tablet    Sig: Take 1 tablet (800 mg total) by mouth 3 (three) times daily as needed (for outbreak).    Dispense:  30 tablet    Refill:  4  . escitalopram (LEXAPRO) 20 MG tablet    Sig: Take 1 tablet (20 mg total) by mouth daily.    Dispense:  90 tablet    Refill:  1    [facility] [place].   Ludwika Rodd Elayne Guerin, M.D. Primary Care at [facility] [address] [place], [place]  [id number] [phone] phone [phone] fax

## 2018-02-16 ENCOUNTER — Encounter: Payer: Self-pay | Admitting: Family Medicine

## 2018-02-16 DIAGNOSIS — A6001 Herpesviral infection of penis: Secondary | ICD-10-CM | POA: Insufficient documentation

## 2018-03-10 ENCOUNTER — Encounter: Payer: Self-pay | Admitting: Family Medicine

## 2018-03-21 ENCOUNTER — Other Ambulatory Visit: Payer: Self-pay | Admitting: Family Medicine

## 2018-03-22 NOTE — Telephone Encounter (Signed)
atorvastatin refill Last Refill:09/03/17 # 90 1 RF Last OV: 02/15/18 PCP: Delia Chimes MD Pharmacy:[facility] [phone] Lipid: 02/15/18  Liver: 02/15/18

## 2019-11-02 NOTE — Telephone Encounter (Signed)
disregard
